# Patient Record
Sex: Male | Born: 1945 | Race: White | Hispanic: No | Marital: Married | Smoking: Never smoker
Health system: Southern US, Community
[De-identification: ages and names within clinical notes are randomized; demographics above are authoritative.]

## PROBLEM LIST (undated history)

## (undated) DIAGNOSIS — Z87442 Personal history of urinary calculi: Secondary | ICD-10-CM

## (undated) DIAGNOSIS — I10 Essential (primary) hypertension: Secondary | ICD-10-CM

## (undated) DIAGNOSIS — C801 Malignant (primary) neoplasm, unspecified: Secondary | ICD-10-CM

## (undated) HISTORY — PX: TONSILLECTOMY: SUR1361

## (undated) HISTORY — PX: PERCUTANEOUS NEPHROSTOLITHOTOMY: SHX2207

---

## 2000-01-18 ENCOUNTER — Ambulatory Visit (HOSPITAL_COMMUNITY): Admission: RE | Admit: 2000-01-18 | Discharge: 2000-01-18 | Payer: Self-pay | Admitting: Gastroenterology

## 2003-12-09 ENCOUNTER — Ambulatory Visit (HOSPITAL_COMMUNITY): Admission: RE | Admit: 2003-12-09 | Discharge: 2003-12-09 | Payer: Self-pay | Admitting: Gastroenterology

## 2014-01-23 DIAGNOSIS — Z23 Encounter for immunization: Secondary | ICD-10-CM | POA: Diagnosis not present

## 2014-01-23 DIAGNOSIS — Z0000 Encounter for general adult medical examination without abnormal findings: Secondary | ICD-10-CM | POA: Diagnosis not present

## 2014-01-23 DIAGNOSIS — R82998 Other abnormal findings in urine: Secondary | ICD-10-CM | POA: Diagnosis not present

## 2014-01-23 DIAGNOSIS — D126 Benign neoplasm of colon, unspecified: Secondary | ICD-10-CM | POA: Diagnosis not present

## 2014-01-23 DIAGNOSIS — Z125 Encounter for screening for malignant neoplasm of prostate: Secondary | ICD-10-CM | POA: Diagnosis not present

## 2014-01-23 DIAGNOSIS — I10 Essential (primary) hypertension: Secondary | ICD-10-CM | POA: Diagnosis not present

## 2014-01-23 DIAGNOSIS — E782 Mixed hyperlipidemia: Secondary | ICD-10-CM | POA: Diagnosis not present

## 2014-03-11 DIAGNOSIS — R972 Elevated prostate specific antigen [PSA]: Secondary | ICD-10-CM | POA: Diagnosis not present

## 2014-03-11 DIAGNOSIS — I10 Essential (primary) hypertension: Secondary | ICD-10-CM | POA: Diagnosis not present

## 2014-03-11 DIAGNOSIS — Z125 Encounter for screening for malignant neoplasm of prostate: Secondary | ICD-10-CM | POA: Diagnosis not present

## 2014-03-11 DIAGNOSIS — E782 Mixed hyperlipidemia: Secondary | ICD-10-CM | POA: Diagnosis not present

## 2014-03-11 DIAGNOSIS — R799 Abnormal finding of blood chemistry, unspecified: Secondary | ICD-10-CM | POA: Diagnosis not present

## 2014-03-11 DIAGNOSIS — R82998 Other abnormal findings in urine: Secondary | ICD-10-CM | POA: Diagnosis not present

## 2014-07-29 DIAGNOSIS — Z125 Encounter for screening for malignant neoplasm of prostate: Secondary | ICD-10-CM | POA: Diagnosis not present

## 2014-07-29 DIAGNOSIS — R972 Elevated prostate specific antigen [PSA]: Secondary | ICD-10-CM | POA: Diagnosis not present

## 2014-08-06 DIAGNOSIS — H2513 Age-related nuclear cataract, bilateral: Secondary | ICD-10-CM | POA: Diagnosis not present

## 2014-08-21 DIAGNOSIS — R972 Elevated prostate specific antigen [PSA]: Secondary | ICD-10-CM | POA: Diagnosis not present

## 2014-08-21 DIAGNOSIS — R351 Nocturia: Secondary | ICD-10-CM | POA: Diagnosis not present

## 2014-08-21 DIAGNOSIS — R3912 Poor urinary stream: Secondary | ICD-10-CM | POA: Diagnosis not present

## 2014-08-21 DIAGNOSIS — N401 Enlarged prostate with lower urinary tract symptoms: Secondary | ICD-10-CM | POA: Diagnosis not present

## 2015-02-27 DIAGNOSIS — K635 Polyp of colon: Secondary | ICD-10-CM | POA: Diagnosis not present

## 2015-02-27 DIAGNOSIS — E782 Mixed hyperlipidemia: Secondary | ICD-10-CM | POA: Diagnosis not present

## 2015-02-27 DIAGNOSIS — R972 Elevated prostate specific antigen [PSA]: Secondary | ICD-10-CM | POA: Diagnosis not present

## 2015-02-27 DIAGNOSIS — Z23 Encounter for immunization: Secondary | ICD-10-CM | POA: Diagnosis not present

## 2015-02-27 DIAGNOSIS — Z0000 Encounter for general adult medical examination without abnormal findings: Secondary | ICD-10-CM | POA: Diagnosis not present

## 2015-02-27 DIAGNOSIS — I10 Essential (primary) hypertension: Secondary | ICD-10-CM | POA: Diagnosis not present

## 2015-03-12 DIAGNOSIS — R972 Elevated prostate specific antigen [PSA]: Secondary | ICD-10-CM | POA: Diagnosis not present

## 2015-03-12 DIAGNOSIS — C61 Malignant neoplasm of prostate: Secondary | ICD-10-CM | POA: Diagnosis not present

## 2015-03-18 DIAGNOSIS — R3912 Poor urinary stream: Secondary | ICD-10-CM | POA: Diagnosis not present

## 2015-03-18 DIAGNOSIS — R351 Nocturia: Secondary | ICD-10-CM | POA: Diagnosis not present

## 2015-03-18 DIAGNOSIS — C61 Malignant neoplasm of prostate: Secondary | ICD-10-CM | POA: Diagnosis not present

## 2015-03-19 ENCOUNTER — Other Ambulatory Visit (HOSPITAL_COMMUNITY): Payer: Self-pay | Admitting: Urology

## 2015-03-19 DIAGNOSIS — C61 Malignant neoplasm of prostate: Secondary | ICD-10-CM

## 2015-04-03 ENCOUNTER — Encounter (HOSPITAL_COMMUNITY)
Admission: RE | Admit: 2015-04-03 | Discharge: 2015-04-03 | Disposition: A | Discharge status: Home / Self Care | Payer: Medicare Other | Source: Ambulatory Visit | Attending: Urology | Admitting: Urology

## 2015-04-03 DIAGNOSIS — N200 Calculus of kidney: Secondary | ICD-10-CM | POA: Diagnosis not present

## 2015-04-03 DIAGNOSIS — K5730 Diverticulosis of large intestine without perforation or abscess without bleeding: Secondary | ICD-10-CM | POA: Diagnosis not present

## 2015-04-03 DIAGNOSIS — C61 Malignant neoplasm of prostate: Secondary | ICD-10-CM | POA: Diagnosis present

## 2015-04-03 DIAGNOSIS — R351 Nocturia: Secondary | ICD-10-CM | POA: Diagnosis not present

## 2015-04-03 DIAGNOSIS — N3289 Other specified disorders of bladder: Secondary | ICD-10-CM | POA: Diagnosis not present

## 2015-04-03 MED ORDER — TECHNETIUM TC 99M MEDRONATE IV KIT
27.4000 | PACK | Freq: Once | INTRAVENOUS | Status: AC | PRN
Start: 2015-04-03 — End: 2015-04-03
  Administered 2015-04-03: 27.4 via INTRAVENOUS

## 2015-04-09 DIAGNOSIS — C61 Malignant neoplasm of prostate: Secondary | ICD-10-CM | POA: Diagnosis not present

## 2015-04-22 DIAGNOSIS — C61 Malignant neoplasm of prostate: Secondary | ICD-10-CM | POA: Diagnosis not present

## 2015-04-22 DIAGNOSIS — N323 Diverticulum of bladder: Secondary | ICD-10-CM | POA: Diagnosis not present

## 2015-04-24 ENCOUNTER — Other Ambulatory Visit: Payer: Self-pay | Admitting: Urology

## 2015-05-05 DIAGNOSIS — C61 Malignant neoplasm of prostate: Secondary | ICD-10-CM | POA: Diagnosis not present

## 2015-05-05 DIAGNOSIS — N323 Diverticulum of bladder: Secondary | ICD-10-CM | POA: Diagnosis not present

## 2015-05-05 DIAGNOSIS — M6281 Muscle weakness (generalized): Secondary | ICD-10-CM | POA: Diagnosis not present

## 2015-05-05 DIAGNOSIS — R278 Other lack of coordination: Secondary | ICD-10-CM | POA: Diagnosis not present

## 2015-05-07 ENCOUNTER — Other Ambulatory Visit (HOSPITAL_COMMUNITY): Payer: Self-pay | Admitting: Urology

## 2015-05-07 NOTE — Patient Instructions (Addendum)
GUSTAVE LINDEMAN  05/07/2015   Your procedure is scheduled on: Thursday  05/22/2015  Report to [facility] Main  Entrance take [place]  elevators to 3rd floor to  [place] at  [place] AM.  Call this number if you have problems the morning of surgery [phone]   Remember: ONLY 1 PERSON MAY GO WITH YOU TO SHORT STAY TO GET  READY MORNING OF [place].  Do not eat food or drink liquids :After Midnight.                   FOLLOW BOWEL PREP INSTRUCTIONS FROM DR. BORDEN'S OFFICE AS DIRECTED WITH A CLEAR LIQUID DIET ALL DAY THE DAY BEFORE YOUR SURGERY!     Take these medicines the morning of surgery with A SIP OF WATER: Metoprolol                               You may not have any metal on your body including hair pins and              piercings  Do not wear jewelry, make-up, lotions, powders or perfumes, deodorant             Do not wear nail polish.  Do not shave  48 hours prior to surgery.              Men may shave face and neck.   Do not bring valuables to the hospital. [place].  Contacts, dentures or bridgework may not be worn into surgery.  Leave suitcase in the car. After surgery it may be brought to your room.     Patients discharged the day of surgery will not be allowed to drive home.  Name and phone number of your driver:  Special Instructions: N/A              Please read over the following fact sheets you were given: _____________________________________________________________________             [facility] - Preparing for Surgery Before surgery, you can play an important role.  Because skin is not sterile, your skin needs to be as free of germs as possible.  You can reduce the number of germs on your skin by washing with CHG (chlorahexidine gluconate) soap before surgery.  CHG is an antiseptic cleaner which kills germs and bonds with the skin to continue killing germs even after  washing. Please DO NOT use if you have an allergy to CHG or antibacterial soaps.  If your skin becomes reddened/irritated stop using the CHG and inform your nurse when you arrive at Short Stay. Do not shave (including legs and underarms) for at least 48 hours prior to the first CHG shower.  You may shave your face/neck. Please follow these instructions carefully:  1.  Shower with CHG Soap the night before surgery and the  morning of Surgery.  2.  If you choose to wash your hair, wash your hair first as usual with your  normal  shampoo.  3.  After you shampoo, rinse your hair and body thoroughly to remove the  shampoo.  4.  Use CHG as you would any other liquid soap.  You can apply chg directly  to the skin and wash                       Gently with a scrungie or clean washcloth.  5.  Apply the CHG Soap to your body ONLY FROM THE NECK DOWN.   Do not use on face/ open                           Wound or open sores. Avoid contact with eyes, ears mouth and genitals (private parts).                       Wash face,  Genitals (private parts) with your normal soap.             6.  Wash thoroughly, paying special attention to the area where your surgery  will be performed.  7.  Thoroughly rinse your body with warm water from the neck down.  8.  DO NOT shower/wash with your normal soap after using and rinsing off  the CHG Soap.                9.  Pat yourself dry with a clean towel.            10.  Wear clean pajamas.            11.  Place clean sheets on your bed the night of your first shower and do not  sleep with pets. Day of Surgery : Do not apply any lotions/deodorants the morning of surgery.  Please wear clean clothes to the hospital/surgery center.  FAILURE TO FOLLOW THESE INSTRUCTIONS MAY RESULT IN THE CANCELLATION OF YOUR SURGERY PATIENT SIGNATURE_________________________________  NURSE  SIGNATURE__________________________________  ________________________________________________________________________   Adam Phenix  An incentive spirometer is a tool that can help keep your lungs clear and active. This tool measures how well you are filling your lungs with each breath. Taking long deep breaths may help reverse or decrease the chance of developing breathing (pulmonary) problems (especially infection) following:  A long period of time when you are unable to move or be active. BEFORE THE PROCEDURE   If the spirometer includes an indicator to show your best effort, your nurse or respiratory therapist will set it to a desired goal.  If possible, sit up straight or lean slightly forward. Try not to slouch.  Hold the incentive spirometer in an upright position. INSTRUCTIONS FOR USE   Sit on the edge of your bed if possible, or sit up as far as you can in bed or on a chair.  Hold the incentive spirometer in an upright position.  Breathe out normally.  Place the mouthpiece in your mouth and seal your lips tightly around it.  Breathe in slowly and as deeply as possible, raising the piston or the ball toward the top of the column.  Hold your breath for 3-5 seconds or for as long as possible. Allow the piston or ball to fall to the bottom of the column.  Remove the mouthpiece from your mouth and breathe out normally.  Rest for a few seconds and repeat Steps 1 through 7 at least 10 times every 1-2 hours when you are awake. Take your time and take a few normal breaths between deep breaths.  The spirometer may include an indicator to show  your best effort. Use the indicator as a goal to work toward during each repetition.  After each set of 10 deep breaths, practice coughing to be sure your lungs are clear. If you have an incision (the cut made at the time of surgery), support your incision when coughing by placing a pillow or rolled up towels firmly against it. Once  you are able to get out of bed, walk around indoors and cough well. You may stop using the incentive spirometer when instructed by your caregiver.  RISKS AND COMPLICATIONS  Take your time so you do not get dizzy or light-headed.  If you are in pain, you may need to take or ask for pain medication before doing incentive spirometry. It is harder to take a deep breath if you are having pain. AFTER USE  Rest and breathe slowly and easily.  It can be helpful to keep track of a log of your progress. Your caregiver can provide you with a simple table to help with this. If you are using the spirometer at home, follow these instructions: [place] IF:   You are having difficultly using the spirometer.  You have trouble using the spirometer as often as instructed.  Your pain medication is not giving enough relief while using the spirometer.  You develop fever of 100.5 F (38.1 C) or higher. SEEK IMMEDIATE MEDICAL CARE IF:   You cough up bloody sputum that had not been present before.  You develop fever of 102 F (38.9 C) or greater.  You develop worsening pain at or near the incision site. MAKE SURE YOU:   Understand these instructions.  Will watch your condition.  Will get help right away if you are not doing well or get worse. Document Released: 02/07/2007 Document Revised: 12/20/2011 Document Reviewed: 04/10/2007 ExitCare Patient Information 2014 ExitCare, [place].   ________________________________________________________________________  WHAT IS A BLOOD TRANSFUSION? Blood Transfusion Information  A transfusion is the replacement of blood or some of its parts. Blood is made up of multiple cells which provide different functions.  Red blood cells carry oxygen and are used for blood loss replacement.  White blood cells fight against infection.  Platelets control bleeding.  Plasma helps clot blood.  Other blood products are available for specialized needs, such as  hemophilia or other clotting disorders. BEFORE THE TRANSFUSION  Who gives blood for transfusions?   Healthy volunteers who are fully evaluated to make sure their blood is safe. This is blood bank blood. Transfusion therapy is the safest it has ever been in the practice of medicine. Before blood is taken from a donor, a complete history is taken to make sure that person has no history of diseases nor engages in risky social behavior (examples are intravenous drug use or sexual activity with multiple partners). The donor's travel history is screened to minimize risk of transmitting infections, such as malaria. The donated blood is tested for signs of infectious diseases, such as HIV and hepatitis. The blood is then tested to be sure it is compatible with you in order to minimize the chance of a transfusion reaction. If you or a relative donates blood, this is often done in anticipation of surgery and is not appropriate for emergency situations. It takes many days to process the donated blood. RISKS AND COMPLICATIONS Although transfusion therapy is very safe and saves many lives, the main dangers of transfusion include:   Getting an infectious disease.  Developing a transfusion reaction. This is an allergic reaction to  something in the blood you were given. Every precaution is taken to prevent this. The decision to have a blood transfusion has been considered carefully by your caregiver before blood is given. Blood is not given unless the benefits outweigh the risks. AFTER THE TRANSFUSION  Right after receiving a blood transfusion, you will usually feel much better and more energetic. This is especially true if your red blood cells have gotten low (anemic). The transfusion raises the level of the red blood cells which carry oxygen, and this usually causes an energy increase.  The nurse administering the transfusion will monitor you carefully for complications. HOME CARE INSTRUCTIONS  No special  instructions are needed after a transfusion. You may find your energy is better. Speak with your caregiver about any limitations on activity for underlying diseases you may have. SEEK MEDICAL CARE IF:   Your condition is not improving after your transfusion.  You develop redness or irritation at the intravenous (IV) site. SEEK IMMEDIATE MEDICAL CARE IF:  Any of the following symptoms occur over the next 12 hours:  Shaking chills.  You have a temperature by mouth above 102 F (38.9 C), not controlled by medicine.  Chest, back, or muscle pain.  People around you feel you are not acting correctly or are confused.  Shortness of breath or difficulty breathing.  Dizziness and fainting.  You get a rash or develop hives.  You have a decrease in urine output.  Your urine turns a dark color or changes to pink, red, or brown. Any of the following symptoms occur over the next 10 days:  You have a temperature by mouth above 102 F (38.9 C), not controlled by medicine.  Shortness of breath.  Weakness after normal activity.  The white part of the eye turns yellow (jaundice).  You have a decrease in the amount of urine or are urinating less often.  Your urine turns a dark color or changes to pink, red, or brown. Document Released: 09/24/2000 Document Revised: 12/20/2011 Document Reviewed: 05/13/2008 ExitCare Patient Information 2014 ExitCare, [place].  _______________________________________________________________________   CLEAR LIQUID DIET   Foods Allowed                                                                     Foods Excluded  Coffee and tea, regular and decaf                             liquids that you cannot  Plain Jell-O in any flavor                                             see through such as: Fruit ices (not with fruit pulp)                                     milk, soups, orange juice  Iced Popsicles  All solid  food Carbonated beverages, regular and diet                                    Cranberry, grape and apple juices Sports drinks like Gatorade Lightly seasoned clear broth or consume(fat free) Sugar, honey syrup  Sample Menu Breakfast                                Lunch                                     Supper Cranberry juice                    Beef broth                            Chicken broth Jell-O                                     Grape juice                           Apple juice Coffee or tea                        Jell-O                                      Popsicle                                                Coffee or tea                        Coffee or tea  _____________________________________________________________________

## 2015-05-08 ENCOUNTER — Ambulatory Visit (HOSPITAL_COMMUNITY)
Admission: RE | Admit: 2015-05-08 | Discharge: 2015-05-08 | Disposition: A | Discharge status: Home / Self Care | Payer: Medicare Other | Source: Ambulatory Visit | Attending: Anesthesiology | Admitting: Anesthesiology

## 2015-05-08 ENCOUNTER — Encounter (HOSPITAL_COMMUNITY): Payer: Self-pay

## 2015-05-08 ENCOUNTER — Other Ambulatory Visit: Payer: Self-pay

## 2015-05-08 ENCOUNTER — Encounter (INDEPENDENT_AMBULATORY_CARE_PROVIDER_SITE_OTHER): Payer: Self-pay

## 2015-05-08 ENCOUNTER — Encounter (HOSPITAL_COMMUNITY)
Admission: RE | Admit: 2015-05-08 | Discharge: 2015-05-08 | Disposition: A | Discharge status: Home / Self Care | Payer: Medicare Other | Source: Ambulatory Visit | Attending: Urology | Admitting: Urology

## 2015-05-08 DIAGNOSIS — C61 Malignant neoplasm of prostate: Secondary | ICD-10-CM | POA: Insufficient documentation

## 2015-05-08 DIAGNOSIS — Z01818 Encounter for other preprocedural examination: Secondary | ICD-10-CM | POA: Insufficient documentation

## 2015-05-08 HISTORY — DX: Malignant (primary) neoplasm, unspecified: C80.1

## 2015-05-08 HISTORY — DX: Essential (primary) hypertension: I10

## 2015-05-08 LAB — BASIC METABOLIC PANEL
Anion gap: 7 (ref 5–15)
BUN: 24 mg/dL — AB (ref 6–20)
CHLORIDE: 109 mmol/L (ref 101–111)
CO2: 24 mmol/L (ref 22–32)
Calcium: 9.2 mg/dL (ref 8.9–10.3)
Creatinine, Ser: 1.33 mg/dL — ABNORMAL HIGH (ref 0.61–1.24)
GFR calc Af Amer: >60 mL/min (ref >60)
GFR calc non Af Amer: 53 mL/min — ABNORMAL LOW (ref >60)
GLUCOSE: 87 mg/dL (ref 65–99)
Potassium: 4.1 mmol/L (ref 3.5–5.1)
SODIUM: 140 mmol/L (ref 135–145)

## 2015-05-08 LAB — CBC
HEMATOCRIT: 42.1 % (ref 39.0–52.0)
Hemoglobin: 13.8 g/dL (ref 13.0–17.0)
MCH: 29.7 pg (ref 26.0–34.0)
MCHC: 32.8 g/dL (ref 30.0–36.0)
MCV: 90.7 fL (ref 78.0–100.0)
PLATELETS: 202 10*3/uL (ref 150–400)
RBC: 4.64 MIL/uL (ref 4.22–5.81)
RDW: 14.2 % (ref 11.5–15.5)
WBC: 4.7 10*3/uL (ref 4.0–10.5)

## 2015-05-08 NOTE — Progress Notes (Signed)
   05/08/15 1521  OBSTRUCTIVE SLEEP APNEA  Have you ever been diagnosed with sleep apnea through a sleep study? No  Do you snore loudly (loud enough to be heard through closed doors)?  1  Do you often feel tired, fatigued, or sleepy during the daytime? 0  Has anyone observed you stop breathing during your sleep? 0  Do you have, or are you being treated for high blood pressure? 1  BMI more than 35 kg/m2? 0  Age over 70 years old? 1  Neck circumference greater than 40 cm/16 inches? 0  Gender: 1

## 2015-05-21 NOTE — H&P (Signed)
Chief Complaint Prostate Cancer   Reason For Visit Reason for consult: To discuss treatment options for prostate cancer and his bladder diverticulum and specifically to consider robotic surgery.  Physician requesting consult: Dr. Carolan Clines  PCP: Dr. 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 He saw Dr. Gaynelle Arabian who performed a 4K test that demonstrated a total PSA of 4.09 and 4K score suggesting a 19% of high grade disease. Dr. Gaynelle Arabian recommended a prostate biopsy at that time, but Curtis Odom wanted to delay his biopsy. He ultimately underwent a prostate needle biopsy on 03/12/15 which confirmed Gleason 4+5=9 adenocarcinoma of the prostate with 2 out of 12 biopsy cores positive. He has no family history of prostate cancer. He underwent staging studies on 04/03/15 including a bone scan and CT scan which were both negative for metastatic disease. 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    ** He has minimal comorbid conditions including only hypertension.    TNM stage: cT1c N0 M0  PSA: 4.09  Gleason score: 4+5=9  Biopsy (03/12/15): 2/12 cores -- L lateral mid (< 5%, 4+5=9), R lateral base (40%, 4+5=9)  Prostate volume: 18.5 cc    Nomogram  OC disease: 38%  EPE: 59%  SVI: 9%  LNI: 11%  PFS (surgery): 63% at 5 years, 47% at 10 years    Urinary function: He denies significant urinary symptoms. IPSS is 2.  Erectile function: He does have mild-to-moderate erectile dysfunction. He does find it somewhat difficult to obtain erections although has not undergone prior treatment. SHIM score is 20.   Past Medical History Problems  1. History of hypertension (Z86.79)  Surgical  History Problems  1. History of Tonsillectomy  Current Meds 1. Lopressor 50 MG Oral Tablet;  Therapy: (Recorded:11Nov2015) to Recorded  Allergies No known drug allergies. He has had GI upset but no severe reaction associated with ciprofloxacin.   Family History Problems  1. Family history of Deceased : Mother, Father 2. Family history of colon cancer (Z80.0) : Mother 3. Family history of congestive heart failure (Z82.49) : Father 4. Denied: Family history of prostate cancer  Social History Problems    Married   Never a smoker   No alcohol use   Retired  Review of Systems Genitourinary, constitutional, skin, eye, otolaryngeal, hematologic/lymphatic, cardiovascular, pulmonary, endocrine, musculoskeletal, gastrointestinal, neurological and psychiatric system(s) were reviewed and pertinent findings if present are noted and are otherwise negative.    Vitals Vital Signs [Data Includes: Last 1 Day]  Recorded: 12Jul2016 01:06PM  Blood Pressure: 172 / 80 Temperature: 97.6 F Heart Rate: 63 Recorded: 29Jun2016 05:05PM  Blood Pressure: 178 / 72 Heart Rate: 71  Physical Exam Constitutional: Well nourished and well developed . No acute distress.  ENT:. The ears and nose are normal in appearance.  Neck: The appearance of the neck is normal and no neck mass is present.  Pulmonary: No respiratory distress, normal respiratory rhythm and effort and clear bilateral breath sounds.  Cardiovascular: Heart rate and rhythm are normal . No peripheral edema.  Abdomen: The abdomen is soft and nontender. No masses are palpated. No CVA tenderness. No hernias are palpable. No hepatosplenomegaly noted.  Rectal: Rectal exam demonstrates normal sphincter tone, no tenderness and  no masses. There is some very mild induration noted toward the right base near the seminal vesicle. It is unclear whether this might be related to postbiopsy changes. The prostate is not tender. The left seminal vesicle is  nonpalpable. The right seminal vesicle is nonpalpable. The perineum is normal on inspection.  Genitourinary: Examination of the penis demonstrates no discharge, no masses, no lesions and a normal meatus. The scrotum is without lesions. The right epididymis is palpably normal and non-tender. The left epididymis is palpably normal and non-tender. The right testis is non-tender and without masses. The left testis is non-tender and without masses.  Lymphatics: The femoral and inguinal nodes are not enlarged or tender.  Skin: Normal skin turgor, no visible rash and no visible skin lesions.  Neuro/Psych:. Mood and affect are appropriate.    Results/Data Urine [Data Includes: Last 1 Day]   [id number]  COLOR YELLOW   APPEARANCE CLEAR   SPECIFIC GRAVITY 1.025   pH 5.5   GLUCOSE NEG mg/dL  BILIRUBIN NEG   KETONE NEG mg/dL  BLOOD TRACE   PROTEIN NEG mg/dL  UROBILINOGEN 0.2 mg/dL  NITRITE NEG   LEUKOCYTE ESTERASE NEG   SQUAMOUS EPITHELIAL/HPF RARE   WBC 0-2 WBC/hpf  RBC 0-2 RBC/hpf  BACTERIA RARE   CRYSTALS NONE SEEN   CASTS NONE SEEN   Other MUCUS NOTED    I have independently reviewed his medical records, PSA results, and pathology report. I have also independently reviewed his bone scan and CT scan. Findings are as dictated above.   Assessment Assessed  1. 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 UA With REFLEX; [Do Not Release]; Status:Complete;   Done: [id number] 12:50PM Prostate cancer  2. Follow-up Office  Follow-up  Status: Complete  Done: [id number] 3. PT/OT Referral Referral  Referral  Status: Hold For - Date of Service,Physical Therapy   Requested for: 25Jul2016  Discussion/Summary 1. High risk clinically localized prostate cancer: I had a detailed discussion with Curtis Odom and his wife today. I did recommend therapy of curative intent considering his high risk localized malignancy in the absence of metastatic disease. We discussed  the options of proceeding with primary surgical therapy possibly with the need for adjuvant therapy versus the option of primary radiation therapy in combination with androgen deprivation. We reviewed the pros and cons of each of these approaches.   The patient was counseled about the natural history of prostate cancer and the standard treatment options that are available for prostate cancer. It was explained to him how his age and life expectancy, clinical stage, Gleason score, and PSA affect his prognosis, the decision to proceed with additional staging studies, as well as how that information influences recommended treatment strategies. We discussed the roles for active surveillance, radiation therapy, surgical therapy, androgen deprivation, as well as ablative therapy options for the treatment of prostate cancer as appropriate to his individual cancer situation. We discussed the risks and benefits of these options with regard to their impact on cancer control and also in terms of potential adverse events, complications, and impact on quiality of life particularly related to urinary, bowel, and sexual function. The patient was encouraged to ask questions throughout the discussion today and all questions were answered to his stated satisfaction. In addition, the patient was provided with and/or directed to appropriate resources and literature for further education about prostate cancer and treatment options.   We discussed surgical therapy for prostate cancer including the different available surgical  approaches. We discussed, in detail, the risks and expectations of surgery with regard to cancer control, urinary control, and erectile function as well as the expected postoperative recovery process. Additional risks of surgery including but not limited to bleeding, infection, hernia formation, nerve damage, lymphocele formation, bowel/rectal injury potentially necessitating colostomy, damage to the urinary tract  resulting in urine leakage, urethral stricture, and the cardiopulmonary risks such as myocardial infarction, stroke, death, venothromboembolism, etc. were explained. The risk of open surgical conversion for robotic/laparoscopic prostatectomy was also discussed.     Following our discussion, he is most interested in proceeding with primary surgical therapy. I did offer him a radiation oncology consultation which he will consider. In the meantime, he would like to be scheduled for surgical treatment. He will be scheduled for a robot-assisted laparoscopic radical prostatectomy and bilateral pelvic lymphadenectomy. We will likely perform a partial nerve sparing procedure on the right side considering his digital rectal exam although he does not have evidence for obvious extraprostatic disease.    2. Bladder diverticulum: This has been totally asymptomatic without complicating features over the course of his lifetime. He is pretty sure that this was present on an imaging study he had over 10 years ago. We discussed indications for treatment of a bladder diverticulum and he understands that in the absence of surgery for his prostate cancer, I would not recommend surgical intervention of his diverticulum. However, understanding that this can be fairly easily addressed at the time of his upcoming surgery and that it would be more difficult to treat subsequently if it became an issue postoperatively considering adhesions and scar tissue related to his prostatectomy. He also understands that he may be more likely to become symptomatic after having the catheter with the risk of potentially introducing bacteria into the urine. After discussion, he would like to proceed with bladder diverticulectomy at the time of his radical prostatectomy. We have reviewed the specific potential risks and complications related to this portion of the procedure including the added risk of possible ureteral injury or need for ureteral  reimplantation considering the location of the bladder diverticulum. We also discussed the risk of bladder leak and the need for a postoperative cystogram following catheter drainage for 7-10 days. He understands that I also likely will place a preoperative ureteral stent to help identify the ureter intraoperatively.     3. Nonobstructing renal calculi: He does have a fairly large stone on the right side. He understands that he is unlikely to pass the stone. In the absence of symptoms, I did not recommend aggressive treatment that would delay necessary treatment of his prostate cancer. In addition, if he does have a right ureteral stent placed, this will avoid the risk of right-sided obstruction in the immediate postoperative period regardless.    Cc: Dr. Carolan Clines  Dr. Hulan Fess  A total of 65 minutes were spent in the overall care of the patient today with 54 minutes in direct face to face consultation.    Signatures Electronically signed by : Raynelle Bring, M.D.; Apr 22 2015  2:20PM EST

## 2015-05-22 ENCOUNTER — Encounter (HOSPITAL_COMMUNITY): Payer: Self-pay | Admitting: *Deleted

## 2015-05-22 ENCOUNTER — Inpatient Hospital Stay (HOSPITAL_COMMUNITY): Payer: Medicare Other

## 2015-05-22 ENCOUNTER — Inpatient Hospital Stay (HOSPITAL_COMMUNITY): Payer: Medicare Other | Admitting: Anesthesiology

## 2015-05-22 ENCOUNTER — Encounter (HOSPITAL_COMMUNITY)
Admission: RE | Disposition: A | Discharge status: Home / Self Care | Payer: Self-pay | Source: Ambulatory Visit | Attending: Urology

## 2015-05-22 ENCOUNTER — Inpatient Hospital Stay (HOSPITAL_COMMUNITY)
Admission: RE | Admit: 2015-05-22 | Discharge: 2015-05-23 | DRG: 708 | Disposition: A | Discharge status: Home / Self Care | Payer: Medicare Other | Source: Ambulatory Visit | Attending: Urology | Admitting: Urology

## 2015-05-22 DIAGNOSIS — N529 Male erectile dysfunction, unspecified: Secondary | ICD-10-CM | POA: Diagnosis present

## 2015-05-22 DIAGNOSIS — N200 Calculus of kidney: Secondary | ICD-10-CM | POA: Diagnosis present

## 2015-05-22 DIAGNOSIS — C61 Malignant neoplasm of prostate: Secondary | ICD-10-CM | POA: Diagnosis present

## 2015-05-22 DIAGNOSIS — Z800 Family history of malignant neoplasm of digestive organs: Secondary | ICD-10-CM

## 2015-05-22 DIAGNOSIS — I10 Essential (primary) hypertension: Secondary | ICD-10-CM | POA: Diagnosis present

## 2015-05-22 DIAGNOSIS — Z8249 Family history of ischemic heart disease and other diseases of the circulatory system: Secondary | ICD-10-CM | POA: Diagnosis not present

## 2015-05-22 DIAGNOSIS — N323 Diverticulum of bladder: Secondary | ICD-10-CM | POA: Diagnosis present

## 2015-05-22 HISTORY — PX: ROBOTIC ASSISTED LAPAROSCOPIC BLADDER DIVERTICULECTOMY: SHX6079

## 2015-05-22 HISTORY — PX: ROBOT ASSISTED LAPAROSCOPIC RADICAL PROSTATECTOMY: SHX5141

## 2015-05-22 HISTORY — PX: CYSTOSCOPY WITH STENT PLACEMENT: SHX5790

## 2015-05-22 HISTORY — PX: LYMPHADENECTOMY: SHX5960

## 2015-05-22 LAB — HEMOGLOBIN AND HEMATOCRIT, BLOOD
HCT: 42.1 % (ref 39.0–52.0)
HEMOGLOBIN: 14.2 g/dL (ref 13.0–17.0)

## 2015-05-22 LAB — ABO/RH: ABO/RH(D): B POS

## 2015-05-22 LAB — TYPE AND SCREEN
ABO/RH(D): B POS
Antibody Screen: NEGATIVE

## 2015-05-22 SURGERY — ROBOTIC ASSISTED LAPAROSCOPIC RADICAL PROSTATECTOMY LEVEL 3
Anesthesia: General | Laterality: Right

## 2015-05-22 MED ORDER — DOCUSATE SODIUM 100 MG PO CAPS
100.0000 mg | ORAL_CAPSULE | Freq: Two times a day (BID) | ORAL | Status: DC
  Administered 2015-05-22 – 2015-05-23 (×2): 100 mg via ORAL
  Filled 2015-05-22 (×3): qty 1

## 2015-05-22 MED ORDER — SULFAMETHOXAZOLE-TRIMETHOPRIM 800-160 MG PO TABS: 1.0000 | ORAL_TABLET | Freq: Two times a day (BID) | ORAL | Status: DC

## 2015-05-22 MED ORDER — HYDROCODONE-ACETAMINOPHEN 5-325 MG PO TABS
1.0000 | ORAL_TABLET | Freq: Four times a day (QID) | ORAL | Status: DC | PRN
Start: 2015-05-22 — End: 2018-06-21

## 2015-05-22 MED ORDER — LACTATED RINGERS IR SOLN
Status: DC | PRN
  Administered 2015-05-22: 1

## 2015-05-22 MED ORDER — ACETAMINOPHEN 325 MG PO TABS: 650.0000 mg | ORAL_TABLET | ORAL | Status: DC | PRN

## 2015-05-22 MED ORDER — PROMETHAZINE HCL 25 MG/ML IJ SOLN: 6.2500 mg | INTRAMUSCULAR | Status: DC | PRN

## 2015-05-22 MED ORDER — KETOROLAC TROMETHAMINE 15 MG/ML IJ SOLN
15.0000 mg | Freq: Four times a day (QID) | INTRAMUSCULAR | Status: DC
  Administered 2015-05-22 – 2015-05-23 (×5): 15 mg via INTRAVENOUS
  Filled 2015-05-22 (×7): qty 1

## 2015-05-22 MED ORDER — FENTANYL CITRATE (PF) 250 MCG/5ML IJ SOLN
INTRAMUSCULAR | Status: AC
  Filled 2015-05-22: qty 25

## 2015-05-22 MED ORDER — CEFAZOLIN SODIUM 1-5 GM-% IV SOLN
1.0000 g | Freq: Three times a day (TID) | INTRAVENOUS | Status: AC
  Administered 2015-05-22 – 2015-05-23 (×2): 1 g via INTRAVENOUS
  Filled 2015-05-22 (×2): qty 50

## 2015-05-22 MED ORDER — PROPOFOL 10 MG/ML IV BOLUS
INTRAVENOUS | Status: AC
  Filled 2015-05-22: qty 20

## 2015-05-22 MED ORDER — GLYCOPYRROLATE 0.2 MG/ML IJ SOLN
INTRAMUSCULAR | Status: DC | PRN
  Administered 2015-05-22: 0.6 mg via INTRAVENOUS

## 2015-05-22 MED ORDER — SUCCINYLCHOLINE CHLORIDE 20 MG/ML IJ SOLN
INTRAMUSCULAR | Status: DC | PRN
  Administered 2015-05-22: 100 mg via INTRAVENOUS

## 2015-05-22 MED ORDER — MIDAZOLAM HCL 2 MG/2ML IJ SOLN
INTRAMUSCULAR | Status: AC
  Filled 2015-05-22: qty 4

## 2015-05-22 MED ORDER — DEXAMETHASONE SODIUM PHOSPHATE 10 MG/ML IJ SOLN
INTRAMUSCULAR | Status: AC
  Filled 2015-05-22: qty 1

## 2015-05-22 MED ORDER — PHENYLEPHRINE HCL 10 MG/ML IJ SOLN
INTRAMUSCULAR | Status: DC | PRN
  Administered 2015-05-22 (×2): 80 ug via INTRAVENOUS

## 2015-05-22 MED ORDER — LIDOCAINE HCL (CARDIAC) 20 MG/ML IV SOLN
INTRAVENOUS | Status: DC | PRN
  Administered 2015-05-22: 100 mg via INTRAVENOUS

## 2015-05-22 MED ORDER — SODIUM CHLORIDE 0.9 % IR SOLN
Status: DC | PRN
  Administered 2015-05-22: 1000 mL via INTRAVESICAL

## 2015-05-22 MED ORDER — HYDROMORPHONE HCL 1 MG/ML IJ SOLN
INTRAMUSCULAR | Status: AC
  Filled 2015-05-22: qty 1

## 2015-05-22 MED ORDER — SODIUM CHLORIDE 0.9 % IV BOLUS (SEPSIS)
1000.0000 mL | Freq: Once | INTRAVENOUS | Status: AC
  Administered 2015-05-22: 1000 mL via INTRAVENOUS

## 2015-05-22 MED ORDER — LACTATED RINGERS IV SOLN
INTRAVENOUS | Status: DC | PRN
  Administered 2015-05-22 (×2): via INTRAVENOUS

## 2015-05-22 MED ORDER — PROPOFOL 10 MG/ML IV BOLUS
INTRAVENOUS | Status: DC | PRN
  Administered 2015-05-22: 200 mg via INTRAVENOUS

## 2015-05-22 MED ORDER — SODIUM CHLORIDE 0.9 % IR SOLN
Status: DC | PRN
  Administered 2015-05-22: 3000 mL via INTRAVESICAL

## 2015-05-22 MED ORDER — MORPHINE SULFATE 10 MG/ML IJ SOLN: 2.0000 mg | INTRAMUSCULAR | Status: DC | PRN

## 2015-05-22 MED ORDER — DEXAMETHASONE SODIUM PHOSPHATE 10 MG/ML IJ SOLN
INTRAMUSCULAR | Status: DC | PRN
  Administered 2015-05-22: 10 mg via INTRAVENOUS

## 2015-05-22 MED ORDER — ONDANSETRON HCL 4 MG/2ML IJ SOLN
INTRAMUSCULAR | Status: AC
Start: 2015-05-22 — End: 2015-05-22
  Filled 2015-05-22: qty 2

## 2015-05-22 MED ORDER — BUPIVACAINE-EPINEPHRINE 0.25% -1:200000 IJ SOLN
INTRAMUSCULAR | Status: DC | PRN
  Administered 2015-05-22: 30 mL

## 2015-05-22 MED ORDER — STERILE WATER FOR IRRIGATION IR SOLN
Status: DC | PRN
  Administered 2015-05-22: 1000 mL

## 2015-05-22 MED ORDER — CEFAZOLIN SODIUM-DEXTROSE 2-3 GM-% IV SOLR
2.0000 g | INTRAVENOUS | Status: AC
  Administered 2015-05-22: 2 g via INTRAVENOUS

## 2015-05-22 MED ORDER — ROCURONIUM BROMIDE 100 MG/10ML IV SOLN
INTRAVENOUS | Status: DC | PRN
  Administered 2015-05-22: 10 mg via INTRAVENOUS
  Administered 2015-05-22: 50 mg via INTRAVENOUS
  Administered 2015-05-22: 20 mg via INTRAVENOUS

## 2015-05-22 MED ORDER — FENTANYL CITRATE (PF) 100 MCG/2ML IJ SOLN
INTRAMUSCULAR | Status: DC | PRN
  Administered 2015-05-22 (×2): 50 ug via INTRAVENOUS
  Administered 2015-05-22: 100 ug via INTRAVENOUS
  Administered 2015-05-22: 50 ug via INTRAVENOUS

## 2015-05-22 MED ORDER — KCL IN DEXTROSE-NACL 20-5-0.45 MEQ/L-%-% IV SOLN
INTRAVENOUS | Status: AC
Start: 2015-05-22 — End: 2015-05-22
  Administered 2015-05-22: 12:00:00
  Filled 2015-05-22: qty 1000

## 2015-05-22 MED ORDER — HEPARIN SODIUM (PORCINE) 1000 UNIT/ML IJ SOLN
INTRAMUSCULAR | Status: AC
  Filled 2015-05-22: qty 1

## 2015-05-22 MED ORDER — ROCURONIUM BROMIDE 100 MG/10ML IV SOLN
INTRAVENOUS | Status: AC
  Filled 2015-05-22: qty 1

## 2015-05-22 MED ORDER — HYDROMORPHONE HCL 1 MG/ML IJ SOLN
0.2500 mg | INTRAMUSCULAR | Status: DC | PRN
  Administered 2015-05-22 (×2): 0.5 mg via INTRAVENOUS

## 2015-05-22 MED ORDER — ONDANSETRON HCL 4 MG/2ML IJ SOLN
INTRAMUSCULAR | Status: DC | PRN
  Administered 2015-05-22: 4 mg via INTRAVENOUS

## 2015-05-22 MED ORDER — DIPHENHYDRAMINE HCL 12.5 MG/5ML PO ELIX: 12.5000 mg | ORAL_SOLUTION | Freq: Four times a day (QID) | ORAL | Status: DC | PRN

## 2015-05-22 MED ORDER — BUPIVACAINE-EPINEPHRINE (PF) 0.25% -1:200000 IJ SOLN
INTRAMUSCULAR | Status: AC
  Filled 2015-05-22: qty 30

## 2015-05-22 MED ORDER — KCL IN DEXTROSE-NACL 20-5-0.45 MEQ/L-%-% IV SOLN
INTRAVENOUS | Status: DC
  Administered 2015-05-22 – 2015-05-23 (×3): via INTRAVENOUS
  Filled 2015-05-22 (×4): qty 1000

## 2015-05-22 MED ORDER — METOPROLOL TARTRATE 25 MG PO TABS
25.0000 mg | ORAL_TABLET | Freq: Two times a day (BID) | ORAL | Status: DC
  Administered 2015-05-22 – 2015-05-23 (×2): 25 mg via ORAL
  Filled 2015-05-22 (×2): qty 1

## 2015-05-22 MED ORDER — NEOSTIGMINE METHYLSULFATE 10 MG/10ML IV SOLN
INTRAVENOUS | Status: DC | PRN
  Administered 2015-05-22: 4 mg via INTRAVENOUS

## 2015-05-22 MED ORDER — LIDOCAINE HCL (CARDIAC) 20 MG/ML IV SOLN
INTRAVENOUS | Status: AC
  Filled 2015-05-22: qty 5

## 2015-05-22 MED ORDER — ACETAMINOPHEN 10 MG/ML IV SOLN
1000.0000 mg | Freq: Once | INTRAVENOUS | Status: AC
  Administered 2015-05-22: 1000 mg via INTRAVENOUS

## 2015-05-22 MED ORDER — PHENYLEPHRINE 40 MCG/ML (10ML) SYRINGE FOR IV PUSH (FOR BLOOD PRESSURE SUPPORT)
PREFILLED_SYRINGE | INTRAVENOUS | Status: AC
  Filled 2015-05-22: qty 10

## 2015-05-22 MED ORDER — CEFAZOLIN SODIUM-DEXTROSE 2-3 GM-% IV SOLR
INTRAVENOUS | Status: AC
  Filled 2015-05-22: qty 50

## 2015-05-22 MED ORDER — DIPHENHYDRAMINE HCL 50 MG/ML IJ SOLN: 12.5000 mg | Freq: Four times a day (QID) | INTRAMUSCULAR | Status: DC | PRN

## 2015-05-22 MED ORDER — LACTATED RINGERS IV SOLN
INTRAVENOUS | Status: DC | PRN
  Administered 2015-05-22: 1 mL

## 2015-05-22 MED ORDER — MIDAZOLAM HCL 5 MG/5ML IJ SOLN
INTRAMUSCULAR | Status: DC | PRN
  Administered 2015-05-22: 2 mg via INTRAVENOUS

## 2015-05-22 SURGICAL SUPPLY — 97 items
ADAPTER GOLDBERG URETERAL (ADAPTER) ×5 IMPLANT
APPLICATOR COTTON TIP 6IN STRL (MISCELLANEOUS) ×5 IMPLANT
APPLICATOR SURGIFLO ENDO (HEMOSTASIS) ×5 IMPLANT
BLADE SURG SZ10 CARB STEEL (BLADE) ×5 IMPLANT
CABLE HIGH FREQUENCY MONO STRZ (ELECTRODE) ×5 IMPLANT
CATH FOLEY 2WAY SLVR 18FR 30CC (CATHETERS) ×5 IMPLANT
CATH ROBINSON RED A/P 16FR (CATHETERS) ×5 IMPLANT
CATH ROBINSON RED A/P 8FR (CATHETERS) ×5 IMPLANT
CATH TIEMANN FOLEY 18FR 5CC (CATHETERS) ×5 IMPLANT
CHLORAPREP W/TINT 26ML (MISCELLANEOUS) ×5 IMPLANT
CLIP LIGATING HEM O LOK PURPLE (MISCELLANEOUS) ×20 IMPLANT
CLOTH BEACON ORANGE TIMEOUT ST (SAFETY) ×5 IMPLANT
CORD HIGH FREQUENCY UNIPOLAR (ELECTROSURGICAL) ×5 IMPLANT
COVER SURGICAL LIGHT HANDLE (MISCELLANEOUS) ×5 IMPLANT
COVER TIP SHEARS 8 DVNC (MISCELLANEOUS) ×3 IMPLANT
COVER TIP SHEARS 8MM DA VINCI (MISCELLANEOUS) ×2
CUTTER ECHEON FLEX ENDO 45 340 (ENDOMECHANICALS) ×5 IMPLANT
DECANTER SPIKE VIAL GLASS SM (MISCELLANEOUS) ×5 IMPLANT
DRAIN CHANNEL 15F RND FF 3/16 (WOUND CARE) ×5 IMPLANT
DRAPE SURG IRRIG POUCH 19X23 (DRAPES) ×5 IMPLANT
DRSG TEGADERM 4X4.75 (GAUZE/BANDAGES/DRESSINGS) ×10 IMPLANT
DRSG TEGADERM 6X8 (GAUZE/BANDAGES/DRESSINGS) ×15 IMPLANT
ELECT REM PT RETURN 9FT ADLT (ELECTROSURGICAL) ×5
ELECTRODE REM PT RTRN 9FT ADLT (ELECTROSURGICAL) ×3 IMPLANT
GAUZE SPONGE 2X2 8PLY STRL LF (GAUZE/BANDAGES/DRESSINGS) ×3 IMPLANT
GAUZE SPONGE 4X4 12PLY STRL (GAUZE/BANDAGES/DRESSINGS) ×5 IMPLANT
GLOVE BIO SURGEON STRL SZ 6.5 (GLOVE) ×4 IMPLANT
GLOVE BIO SURGEONS STRL SZ 6.5 (GLOVE) ×1
GLOVE BIOGEL M STRL SZ7.5 (GLOVE) ×15 IMPLANT
GOWN STRL REUS W/TWL LRG LVL3 (GOWN DISPOSABLE) ×35 IMPLANT
HOLDER FOLEY CATH W/STRAP (MISCELLANEOUS) ×5 IMPLANT
IV LACTATED RINGERS 1000ML (IV SOLUTION) ×5 IMPLANT
KIT ACCESSORY DA VINCI DISP (KITS) ×2
KIT ACCESSORY DVNC DISP (KITS) ×3 IMPLANT
LIQUID BAND (GAUZE/BANDAGES/DRESSINGS) ×5 IMPLANT
NDL SAFETY ECLIPSE 18X1.5 (NEEDLE) ×3 IMPLANT
NEEDLE HYPO 18GX1.5 SHARP (NEEDLE) ×2
NS IRRIG 1000ML POUR BTL (IV SOLUTION) ×5 IMPLANT
PACK ROBOT UROLOGY CUSTOM (CUSTOM PROCEDURE TRAY) ×5 IMPLANT
POSITIONER SURGICAL ARM (MISCELLANEOUS) ×10 IMPLANT
POUCH ENDO CATCH II 15MM (MISCELLANEOUS) ×5 IMPLANT
POUCH SPECIMEN RETRIEVAL 10MM (ENDOMECHANICALS) ×10 IMPLANT
RELOAD GREEN ECHELON 45 (STAPLE) ×5 IMPLANT
RELOAD LINEAR CUT PROX 55 BLUE (ENDOMECHANICALS) ×10 IMPLANT
RELOAD STAPLER GOLD 60MM (STAPLE) ×3 IMPLANT
RELOAD STAPLER WHITE 60MM (STAPLE) ×6 IMPLANT
SET TUBE IRRIG SUCTION NO TIP (IRRIGATION / IRRIGATOR) ×5 IMPLANT
SHEET LAVH (DRAPES) IMPLANT
SOLUTION ELECTROLUBE (MISCELLANEOUS) ×5 IMPLANT
SPONGE GAUZE 2X2 STER 10/PKG (GAUZE/BANDAGES/DRESSINGS) ×2
SPONGE LAP 18X18 X RAY DECT (DISPOSABLE) ×5 IMPLANT
STAPLE ECHEON FLEX 60 POW ENDO (STAPLE) ×5 IMPLANT
STAPLER GUN LINEAR PROX 60 (STAPLE) ×5 IMPLANT
STAPLER PROXIMATE 55 BLUE (STAPLE) ×5 IMPLANT
STAPLER RELOAD GOLD 60MM (STAPLE) ×5
STAPLER RELOAD WHITE 60MM (STAPLE) ×10
STENT CONTOUR 7FRX24X.038 (STENTS) ×10 IMPLANT
STENT SINGLE 7F (STENTS) IMPLANT
STENT URET 6FRX24 CONTOUR (STENTS) ×5 IMPLANT
SURGIFLO W/THROMBIN 8M KIT (HEMOSTASIS) ×5 IMPLANT
SUT ETHILON 3 0 PS 1 (SUTURE) ×5 IMPLANT
SUT MNCRL 3 0 RB1 (SUTURE) ×3 IMPLANT
SUT MNCRL 3 0 VIOLET RB1 (SUTURE) ×3 IMPLANT
SUT MNCRL AB 4-0 PS2 18 (SUTURE) ×10 IMPLANT
SUT MON AB 2-0 SH 27 (SUTURE) ×2
SUT MON AB 2-0 SH27 (SUTURE) ×3 IMPLANT
SUT MONOCRYL 3 0 RB1 (SUTURE) ×4
SUT PDS AB 1 CTX 36 (SUTURE) ×10 IMPLANT
SUT PDS AB 3-0 SH 27 (SUTURE) IMPLANT
SUT PDS AB 4-0 RB1 27 (SUTURE) ×20 IMPLANT
SUT PDS AB 4-0 SH 27 (SUTURE) ×25 IMPLANT
SUT SILK 0 (SUTURE) ×4
SUT SILK 0 30XBRD TIE 6 (SUTURE) ×6 IMPLANT
SUT SILK 2 0 (SUTURE) ×2
SUT SILK 2 0 SH CR/8 (SUTURE) ×10 IMPLANT
SUT SILK 2-0 30XBRD TIE 12 (SUTURE) ×3 IMPLANT
SUT SILK 3 0 (SUTURE) ×4
SUT SILK 3 0 12 30 (SUTURE) ×5 IMPLANT
SUT SILK 3-0 18XBRD TIE 12 (SUTURE) ×6 IMPLANT
SUT VIC AB 0 CT1 27 (SUTURE) ×12
SUT VIC AB 0 CT1 27XBRD ANTBC (SUTURE) ×18 IMPLANT
SUT VIC AB 0 UR5 27 (SUTURE) ×5 IMPLANT
SUT VIC AB 2-0 SH 27 (SUTURE) ×4
SUT VIC AB 2-0 SH 27X BRD (SUTURE) ×6 IMPLANT
SUT VIC AB 3-0 SH 27 (SUTURE) ×2
SUT VIC AB 3-0 SH 27X BRD (SUTURE) ×3 IMPLANT
SUT VIC AB 4-0 SH 18 (SUTURE) ×5 IMPLANT
SUT VICRYL 0 UR6 27IN ABS (SUTURE) ×20 IMPLANT
SYR 27GX1/2 1ML LL SAFETY (SYRINGE) ×5 IMPLANT
SYR 3ML LL SCALE MARK (SYRINGE) ×5 IMPLANT
SYSTEM UROSTOMY GENTLE TOUCH (WOUND CARE) ×5 IMPLANT
TOWEL OR 17X26 10 PK STRL BLUE (TOWEL DISPOSABLE) ×5 IMPLANT
TOWEL OR NON WOVEN STRL DISP B (DISPOSABLE) ×5 IMPLANT
TROCAR XCEL 12X100 BLDLESS (ENDOMECHANICALS) ×5 IMPLANT
URINEMETER 200ML W/220 (MISCELLANEOUS) ×5 IMPLANT
WATER STERILE IRR 1500ML POUR (IV SOLUTION) ×10 IMPLANT
YANKAUER SUCT BULB TIP 10FT TU (MISCELLANEOUS) ×5 IMPLANT

## 2015-05-22 NOTE — Interval H&P Note (Signed)
History and Physical Interval Note:  05/22/2015 7:25 AM  Curtis Odom  has presented today for surgery, with the diagnosis of PROSTATE CANCER, BLADDER DIVERTICULUM  The various methods of treatment have been discussed with the patient and family. After consideration of risks, benefits and other options for treatment, the patient has consented to  Procedure(s): ROBOTIC ASSISTED LAPAROSCOPIC RADICAL PROSTATECTOMY LEVEL 3 (N/A) ROBOTIC ASSISTED LAPAROSCOPIC BLADDER DIVERTICULECTOMY (N/A) PELVIC LYMPHADENECTOMY (Bilateral) CYSTOSCOPY WITH STENT PLACEMENT (Right) as a surgical intervention .  The patient's history has been reviewed, patient examined, no change in status, stable for surgery.  I have reviewed the patient's chart and labs.  Questions were answered to the patient's satisfaction.     [name],LES

## 2015-05-22 NOTE — Anesthesia Postprocedure Evaluation (Signed)
  Anesthesia Post-op Note  Patient: Curtis Odom  Procedure(s) Performed: Procedure(s) (LRB): ROBOTIC ASSISTED LAPAROSCOPIC RADICAL PROSTATECTOMY LEVEL 3 (N/A) ROBOTIC ASSISTED LAPAROSCOPIC BLADDER DIVERTICULECTOMY (N/A) PELVIC LYMPHADENECTOMY (Bilateral) CYSTOSCOPY WITH STENT PLACEMENT (Right)  Patient Location: PACU  Anesthesia Type: General  Level of Consciousness: awake and alert   Airway and Oxygen Therapy: Patient Spontanous Breathing  Post-op Pain: mild  Post-op Assessment: Post-op Vital signs reviewed, Patient's Cardiovascular Status Stable, Respiratory Function Stable, Patent Airway and No signs of Nausea or vomiting  Last Vitals:  Filed Vitals:   05/22/15 1200  BP: 107/74  Pulse: 56  Temp:   Resp: 19    Post-op Vital Signs: stable   Complications: No apparent anesthesia complications

## 2015-05-22 NOTE — Discharge Instructions (Signed)

## 2015-05-22 NOTE — Transfer of Care (Signed)
Immediate Anesthesia Transfer of Care Note  Patient: Curtis Odom  Procedure(s) Performed: Procedure(s): ROBOTIC ASSISTED LAPAROSCOPIC RADICAL PROSTATECTOMY LEVEL 3 (N/A) ROBOTIC ASSISTED LAPAROSCOPIC BLADDER DIVERTICULECTOMY (N/A) PELVIC LYMPHADENECTOMY (Bilateral) CYSTOSCOPY WITH STENT PLACEMENT (Right)  Patient Location: PACU  Anesthesia Type:General  Level of Consciousness: sedated  Airway & Oxygen Therapy: Patient Spontanous Breathing and Patient connected to face mask oxygen  Post-op Assessment: Report given to RN and Post -op Vital signs reviewed and stable  Post vital signs: Reviewed and stable  Last Vitals:  Filed Vitals:   05/22/15 0513  BP: 153/86  Pulse: 55  Temp: 36.6 C  Resp: 16    Complications: No apparent anesthesia complications

## 2015-05-22 NOTE — Progress Notes (Signed)
Patient ID: Curtis Odom, male   DOB: [birth year], 69 y.o.   MRN: [id number] Post-op note  Subjective: The patient is doing well.  No complaints.  Denies N/V  Objective: Vital signs in last 24 hours: Temp:  [97 F (36.1 C)-97.9 F (36.6 C)] 97.4 F (36.3 C) (08/11 1235) Pulse Rate:  [52-63] 52 (08/11 1251) Resp:  [14-19] 17 (08/11 1235) BP: (102-153)/(57-86) 125/67 mmHg (08/11 1251) SpO2:  [97 %-100 %] 100 % (08/11 1251) Weight:  [83.28 kg (183 lb 9.6 oz)-85.7 kg (188 lb 15 oz)] 85.7 kg (188 lb 15 oz) (08/11 1251)  Intake/Output from previous day:   Intake/Output this shift: Total I/O In: 1940 [I.V.:1900; Other:40] Out: 210 [Urine:60; Drains:50; Blood:100]  Physical Exam:  General: Alert and oriented. Abdomen: Soft, Nondistended. Incisions: Clean and dry. Urine: dark yellow  Lab Results:  Recent Labs  05/22/15 1148  HGB 14.2  HCT 42.1    Assessment/Plan: POD#0   1) Continue to monitor  2) DVT prophy, clears, IS, amb, pain control   LOS: 0 days   Theadora Noyes 05/22/2015, 4:06 PM

## 2015-05-22 NOTE — Anesthesia Preprocedure Evaluation (Signed)
Anesthesia Evaluation  Patient identified by MRN, date of birth, ID band Patient awake    Reviewed: Allergy & Precautions, NPO status , Patient's Chart, lab work & pertinent test results  Airway Mallampati: II  TM Distance: >3 FB Neck ROM: Full    Dental no notable dental hx.    Pulmonary neg pulmonary ROS,  breath sounds clear to auscultation  Pulmonary exam normal       Cardiovascular hypertension, Pt. on medications and Pt. on home beta blockers Normal cardiovascular examRhythm:Regular Rate:Normal     Neuro/Psych negative neurological ROS  negative psych ROS   GI/Hepatic negative GI ROS, Neg liver ROS,   Endo/Other  negative endocrine ROS  Renal/GU negative Renal ROS  negative genitourinary   Musculoskeletal negative musculoskeletal ROS (+)   Abdominal   Peds negative pediatric ROS (+)  Hematology negative hematology ROS (+)   Anesthesia Other Findings   Reproductive/Obstetrics negative OB ROS                             Anesthesia Physical Anesthesia Plan  ASA: II  Anesthesia Plan: General   Post-op Pain Management:    Induction: Intravenous  Airway Management Planned: Oral ETT  Additional Equipment:   Intra-op Plan:   Post-operative Plan: Extubation in OR  Informed Consent: I have reviewed the patients History and Physical, chart, labs and discussed the procedure including the risks, benefits and alternatives for the proposed anesthesia with the patient or authorized representative who has indicated his/her understanding and acceptance.   Dental advisory given  Plan Discussed with: CRNA and Surgeon  Anesthesia Plan Comments:         Anesthesia Quick Evaluation

## 2015-05-22 NOTE — Op Note (Signed)
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 Robotic assisted laparoscopic radical prostatectomy (bilateral nerve sparing) 2. Bilateral robotic assisted laparoscopic pelvic lymphadenectomy 3. Cystoscopy 4. Right ureteral stent (6 x 24) 5. Robotic assisted laparoscopic bladder diverticulectomy  Surgeon: Pryor Curia. M.D.  Assistant:Amanda Dancy, PA-C  Anesthesia: General  Complications: None  EBL: 100 mL  IVF:  1500 mL crystalloid  Specimens: 1. Prostate and seminal vesicles 2. Right pelvic lymph nodes 3. Left pelvic lymph nodes  Disposition of specimens: Pathology  Drains: 1. 20 Fr coude catheter 2. # 19 Blake pelvic drain  Indication: Curtis Odom is a 69 y.o. year old patient with clinically localized prostate cancer and a large bladder diverticulum.  After a thorough review of the management options for treatment of prostate cancer, he elected to proceed with surgical therapy and the above procedure(s). He also elected to proceed with diverticulectomy.  We have discussed the potential benefits and risks of the procedure, side effects of the proposed treatment, the likelihood of the patient achieving the goals of the procedure, and any potential problems that might occur during the procedure or recuperation. Informed consent has been obtained.  Description of procedure:  The patient was taken to the operating room and a general anesthetic was administered. He was given preoperative antibiotics, placed in the dorsal lithotomy position, and prepped and draped in the usual sterile fashion. Next a preoperative timeout was performed.  Cystourethroscopy was then performed revealing a normal urethra with a high bladder neck but otherwise unremarkable.   Inspection of the bladder revealed no bladder tumors or stones.  The left ureteral orifice was in its expected location.  On the right side just lateral to the trigonal ridge was a large 5-6 cm bladder diverticulum.  This was inspected with no tumors or other abnormalities noted.  The right ureteral orifice was located just at the lateral edge of the diverticular neck.  The orifice was cannulated with a 0.38 sensor guide wire which was inserted up into the renal pelvis under fluoroscopic guidance. A 6 x 24 ureteral stent was then advanced over the wire and positioned under fluoroscopic and cystoscopic guidance.  The wire was removed and appropriate stent position was confirmed with fluoroscopy.  The patient was then prepped again with both his genitalia and abdomen prepped.  Another time out was performed. A urethral catheter was placed into the bladder and a site was selected near the umbilicus for placement of the camera port. This was placed using a standard open Hassan technique which allowed entry into the peritoneal cavity under direct vision and without difficulty. A 12 mm port was placed and a pneumoperitoneum established. The camera was then used to inspect the abdomen and there was no evidence of any intra-abdominal injuries or other abnormalities. The remaining abdominal ports were then placed. 8 mm robotic ports were placed in the right lower quadrant, left lower quadrant, and far left lateral abdominal wall. A 5 mm port was placed in the right upper quadrant and a 12 mm port was placed in the right lateral abdominal wall for laparoscopic assistance. All ports were placed under direct vision without difficulty. The surgical cart was then docked.   Using cautery scissors, the bladder was mobilized on the right side as the peritoneum was incised along the lateral edge of the medial umbilical  ligament.  Dissection was carried down to the vas deferens which was followed medially where it crossed the  ureter.  The ureter was easily identified.  The bladder was retracted to the left and superiorly and the bladder diverticulum was able to be identified just lateral to the ureter.  Using careful dissection with a combination of blunt, sharp, and cautery dissection, the bladder diverticulum was dissected free from the surrounding perivesical tissues.  This left the neck of the diverticulum with the ureter entering the bladder just medial to the neck.  It was decided that a small flap of the diverticulum could be used to sew close the bladder to avoid ureteral reimplantation.  The wall of the diverticulum was robust tissue.  The diverticulum was then opened away from the ureter and the urothelium was examined.  The majority of the diverticulum was excised with a small flap of the diverticular tissue left lateral to the ureter.  The bladder opening was then closed with a 3-0 V lock suture closing the bladder wall to the flap of the diverticulum.  This appeared to preserve the ureter without kinking the ureter at all. The bladder was then filled with 300 cc of saline with no leakage noted.  Utilizing the cautery scissors, the bladder was reflected posteriorly allowing entry into the space of Retzius and identification of the endopelvic fascia and prostate. The periprostatic fat was then removed from the prostate allowing full exposure of the endopelvic fascia. The endopelvic fascia was then incised from the apex back to the base of the prostate bilaterally and the underlying levator muscle fibers were swept laterally off the prostate thereby isolating the dorsal venous complex. The dorsal vein was then stapled and divided with a 45 mm Flex Echelon stapler. Attention then turned to the bladder neck which was divided anteriorly thereby allowing entry into the bladder and exposure of the urethral catheter. The catheter balloon was deflated and the catheter was brought into the operative field and used to retract the  prostate anteriorly. The posterior bladder neck was then examined and was divided allowing further dissection between the bladder and prostate posteriorly until the vasa deferentia and seminal vessels were identified. The vasa deferentia were isolated, divided, and lifted anteriorly. The seminal vesicles were dissected down to their tips with care to control the seminal vascular arterial blood supply. These structures were then lifted anteriorly and the space between Denonvillier's fascia and the anterior rectum was developed with a combination of sharp and blunt dissection. This isolated the vascular pedicles of the prostate.  The lateral prostatic fascia was then sharply incised allowing release of the neurovascular bundles bilaterally. The vascular pedicles of the prostate were then ligated with Weck clips between the prostate and neurovascular bundles and divided with sharp cold scissor dissection resulting in neurovascular bundle preservation. The neurovascular bundles were then separated off the apex of the prostate and urethra bilaterally.  The urethra was then sharply transected allowing the prostate specimen to be disarticulated. The pelvis was copiously irrigated and hemostasis was ensured. There was no evidence for rectal injury.  Attention then turned to the right pelvic sidewall. The fibrofatty tissue between the external iliac vein, confluence of the iliac vessels, hypogastric artery, and Cooper's ligament was dissected free from the pelvic sidewall with care to preserve the obturator nerve. Weck clips were used for lymphostasis and hemostasis. An identical procedure was performed on the contralateral side and the lymphatic packets were removed for permanent pathologic analysis.  Attention then turned  to the urethral anastomosis. A 2-0 Vicryl slip knot was placed between Denonvillier's fascia, the posterior bladder neck, and the posterior urethra to reapproximate these structures. A  double-armed 3-0 Monocryl suture was then used to perform a 360 running tension-free anastomosis between the bladder neck and urethra. A new urethral catheter was then placed into the bladder and irrigated. There were no blood clots within the bladder and the anastomosis appeared to be watertight. A #19 Blake drain was then brought through the left lateral 8 mm port site and positioned appropriately within the pelvis. It was secured to the skin with a nylon suture. The surgical cart was then undocked. The right lateral 12 mm port site was closed at the fascial level with a 0 Vicryl suture placed laparoscopically. All remaining ports were then removed under direct vision. The prostate specimen was removed intact within the Endopouch retrieval bag via the periumbilical camera port site. This fascial opening was closed with two running 0 Vicryl sutures. 0.25% Marcaine was then injected into all port sites and all incisions were reapproximated at the skin level with 4-0 Monocryl subcuticular sutures and Dermabond. The patient appeared to tolerate the procedure well and without complications. The patient was able to be extubated and transferred to the recovery unit in satisfactory condition.   Pryor Curia MD

## 2015-05-22 NOTE — Anesthesia Procedure Notes (Signed)
Procedure Name: Intubation Date/Time: 05/22/2015 7:27 AM Performed by: Lind Covert Pre-anesthesia Checklist: Patient identified, Emergency Drugs available, Suction available, Patient being monitored and Timeout performed Patient Re-evaluated:Patient Re-evaluated prior to inductionOxygen Delivery Method: Circle system utilized Preoxygenation: Pre-oxygenation with 100% oxygen Intubation Type: IV induction Ventilation: Mask ventilation without difficulty Laryngoscope Size: Mac and 4 Grade View: Grade I Tube type: Oral Tube size: 7.5 mm Number of attempts: 1 Airway Equipment and Method: Stylet Placement Confirmation: ETT inserted through vocal cords under direct vision,  positive ETCO2 and breath sounds checked- equal and bilateral Secured at: 22 cm Tube secured with: Tape Dental Injury: Teeth and Oropharynx as per pre-operative assessment

## 2015-05-22 NOTE — Progress Notes (Signed)
Utilization review completed.  

## 2015-05-23 ENCOUNTER — Encounter (HOSPITAL_COMMUNITY): Payer: Self-pay | Admitting: Urology

## 2015-05-23 LAB — HEMOGLOBIN AND HEMATOCRIT, BLOOD
HCT: 36.4 % — ABNORMAL LOW (ref 39.0–52.0)
Hemoglobin: 12 g/dL — ABNORMAL LOW (ref 13.0–17.0)

## 2015-05-23 MED ORDER — BISACODYL 10 MG RE SUPP
10.0000 mg | Freq: Once | RECTAL | Status: AC
  Administered 2015-05-23: 10 mg via RECTAL
  Filled 2015-05-23: qty 1

## 2015-05-23 MED ORDER — HYDROCODONE-ACETAMINOPHEN 5-325 MG PO TABS: 1.0000 | ORAL_TABLET | Freq: Four times a day (QID) | ORAL | Status: DC | PRN

## 2015-05-23 NOTE — Discharge Summary (Signed)
  Date of admission: 05/22/2015  Date of discharge: 05/23/2015  Admission diagnosis: Prostate Cancer  Discharge diagnosis: Prostate Cancer  History and Physical: For full details, please see admission history and physical. Briefly, Curtis Odom is a 69 y.o. gentleman with localized prostate cancer.  After discussing management/treatment options, he elected to proceed with surgical treatment.  Hospital Course: Curtis Odom was taken to the operating room on 05/22/2015 and underwent a robotic assisted laparoscopic radical prostatectomy. He tolerated this procedure well and without complications. Postoperatively, he was able to be transferred to a regular hospital room following recovery from anesthesia.  He was able to begin ambulating the night of surgery. He remained hemodynamically stable overnight.  He had excellent urine output with appropriately minimal output from his pelvic drain and his pelvic drain was removed on POD #1.  He was transitioned to oral pain medication, tolerated a clear liquid diet, and had met all discharge criteria and was able to be discharged home later on POD#1.  Laboratory values:  Recent Labs  05/22/15 1148 05/23/15 0453  HGB 14.2 12.0*  HCT 42.1 36.4*    Disposition: Home  Discharge instruction: He was instructed to be ambulatory but to refrain from heavy lifting, strenuous activity, or driving. He was instructed on urethral catheter care.  Discharge medications:     Medication List    STOP taking these medications        acidophilus Caps capsule      TAKE these medications        acetaminophen 500 MG tablet  Commonly known as:  TYLENOL  Take 1,000 mg by mouth every 6 (six) hours as needed for mild pain.     HYDROcodone-acetaminophen 5-325 MG per tablet  Commonly known as:  NORCO  Take 1-2 tablets by mouth every 6 (six) hours as needed.     metoprolol 50 MG tablet  Commonly known as:  LOPRESSOR  Take 25 mg by mouth 2 (two) times daily.      sulfamethoxazole-trimethoprim 800-160 MG per tablet  Commonly known as:  BACTRIM DS,SEPTRA DS  Take 1 tablet by mouth 2 (two) times daily. Start the day prior to foley removal appointment        Followup: He will followup in 1 week for catheter removal and to discuss his surgical pathology results.

## 2015-05-23 NOTE — Care Management Note (Signed)
Case Management Note  Patient Details  Name: Curtis Odom MRN: [id number] Date of Birth: Sep 15, 1946  Subjective/Objective:  69 y/o m admitted w/Prostate Ca. S/p lap prostatectomy. From home.                  Action/Plan:d/c home no needs or roders.   Expected Discharge Date:                 Expected Discharge Plan:  Home/Self Care  In-House Referral:     Discharge planning Services  CM Consult  Post Acute Care Choice:    Choice offered to:     DME Arranged:    DME Agency:     HH Arranged:    [place] Agency:     Status of Service:  Completed, signed off  Medicare Important Message Given:    Date Medicare IM Given:    Medicare IM give by:    Date Additional Medicare IM Given:    Additional Medicare Important Message give by:     If discussed at [place] of Stay Meetings, dates discussed:    Additional Comments:  Dessa Phi, RN 05/23/2015, 2:56 PM

## 2015-05-23 NOTE — Progress Notes (Signed)
Patient ID: Curtis Odom, male   DOB: 1946-06-06, 69 y.o.   MRN: [id number]  1 Day Post-Op Subjective: The patient is doing well.  No nausea or vomiting. Pain is adequately controlled.  Objective: Vital signs in last 24 hours: Temp:  [97 F (36.1 C)-98.5 F (36.9 C)] 98.1 F (36.7 C) (08/12 0427) Pulse Rate:  [52-82] 65 (08/12 0427) Resp:  [14-19] 18 (08/12 0427) BP: (102-143)/(54-74) 114/62 mmHg (08/12 0427) SpO2:  [94 %-100 %] 97 % (08/12 0427) Weight:  [85.7 kg (188 lb 15 oz)] 85.7 kg (188 lb 15 oz) (08/11 1251)  Intake/Output from previous day: 08/11 0701 - 08/12 0700 In: 3600 [P.O.:480; I.V.:2990; IV Piggyback:50] Out: 1308 [Urine:2360; Drains:80; Blood:100] Intake/Output this shift: Total I/O In: -  Out: 2030 [Urine:2000; Drains:30]  Physical Exam:  General: Alert and oriented. CV: RRR Lungs: Clear bilaterally. GI: Soft, Nondistended. Incisions: Clean, dry, and intact Urine: Clear Extremities: Nontender, no erythema, no edema.  Lab Results:  Recent Labs  05/22/15 1148 05/23/15 0453  HGB 14.2 12.0*  HCT 42.1 36.4*      Assessment/Plan: POD# 1 s/p robotic prostatectomy and bladder diverticulectomy.  1) SL IVF 2) Ambulate, Incentive spirometry 3) Transition to oral pain medication 4) Dulcolax suppository 5) D/C pelvic drain 6) Recheck H/H 7) Plan for likely discharge later today   Pryor Curia. MD   LOS: 1 day   Crystalyn Delia,LES 05/23/2015, 6:51 AM

## 2015-05-30 DIAGNOSIS — N323 Diverticulum of bladder: Secondary | ICD-10-CM | POA: Diagnosis not present

## 2015-05-30 DIAGNOSIS — C61 Malignant neoplasm of prostate: Secondary | ICD-10-CM | POA: Diagnosis not present

## 2015-06-23 DIAGNOSIS — N393 Stress incontinence (female) (male): Secondary | ICD-10-CM | POA: Diagnosis not present

## 2015-06-23 DIAGNOSIS — R278 Other lack of coordination: Secondary | ICD-10-CM | POA: Diagnosis not present

## 2015-06-23 DIAGNOSIS — M6281 Muscle weakness (generalized): Secondary | ICD-10-CM | POA: Diagnosis not present

## 2015-07-10 DIAGNOSIS — M6281 Muscle weakness (generalized): Secondary | ICD-10-CM | POA: Diagnosis not present

## 2015-07-10 DIAGNOSIS — N393 Stress incontinence (female) (male): Secondary | ICD-10-CM | POA: Diagnosis not present

## 2015-07-10 DIAGNOSIS — R278 Other lack of coordination: Secondary | ICD-10-CM | POA: Diagnosis not present

## 2015-07-10 DIAGNOSIS — C61 Malignant neoplasm of prostate: Secondary | ICD-10-CM | POA: Diagnosis not present

## 2015-07-16 DIAGNOSIS — C61 Malignant neoplasm of prostate: Secondary | ICD-10-CM | POA: Diagnosis not present

## 2015-07-16 DIAGNOSIS — N323 Diverticulum of bladder: Secondary | ICD-10-CM | POA: Diagnosis not present

## 2015-07-30 DIAGNOSIS — M6281 Muscle weakness (generalized): Secondary | ICD-10-CM | POA: Diagnosis not present

## 2015-07-30 DIAGNOSIS — N393 Stress incontinence (female) (male): Secondary | ICD-10-CM | POA: Diagnosis not present

## 2015-07-30 DIAGNOSIS — R278 Other lack of coordination: Secondary | ICD-10-CM | POA: Diagnosis not present

## 2015-08-27 DIAGNOSIS — R278 Other lack of coordination: Secondary | ICD-10-CM | POA: Diagnosis not present

## 2015-08-27 DIAGNOSIS — M6281 Muscle weakness (generalized): Secondary | ICD-10-CM | POA: Diagnosis not present

## 2015-08-27 DIAGNOSIS — N393 Stress incontinence (female) (male): Secondary | ICD-10-CM | POA: Diagnosis not present

## 2015-11-19 DIAGNOSIS — C4441 Basal cell carcinoma of skin of scalp and neck: Secondary | ICD-10-CM | POA: Diagnosis not present

## 2015-11-28 DIAGNOSIS — C61 Malignant neoplasm of prostate: Secondary | ICD-10-CM | POA: Diagnosis not present

## 2015-12-05 DIAGNOSIS — N5201 Erectile dysfunction due to arterial insufficiency: Secondary | ICD-10-CM | POA: Diagnosis not present

## 2015-12-05 DIAGNOSIS — Z0000 Encounter for general adult medical examination without abnormal findings: Secondary | ICD-10-CM | POA: Diagnosis not present

## 2015-12-05 DIAGNOSIS — C61 Malignant neoplasm of prostate: Secondary | ICD-10-CM | POA: Diagnosis not present

## 2015-12-05 DIAGNOSIS — N393 Stress incontinence (female) (male): Secondary | ICD-10-CM | POA: Diagnosis not present

## 2016-03-17 DIAGNOSIS — E782 Mixed hyperlipidemia: Secondary | ICD-10-CM | POA: Diagnosis not present

## 2016-03-17 DIAGNOSIS — Z8546 Personal history of malignant neoplasm of prostate: Secondary | ICD-10-CM | POA: Diagnosis not present

## 2016-03-17 DIAGNOSIS — I10 Essential (primary) hypertension: Secondary | ICD-10-CM | POA: Diagnosis not present

## 2016-03-17 DIAGNOSIS — J309 Allergic rhinitis, unspecified: Secondary | ICD-10-CM | POA: Diagnosis not present

## 2016-03-17 DIAGNOSIS — Z0000 Encounter for general adult medical examination without abnormal findings: Secondary | ICD-10-CM | POA: Diagnosis not present

## 2016-03-17 DIAGNOSIS — K635 Polyp of colon: Secondary | ICD-10-CM | POA: Diagnosis not present

## 2016-05-19 IMAGING — NM NM BONE WHOLE BODY
2 series · 2 of 2 positions shown · non-contrast
Comparison: CT 04/03/2015 .

CLINICAL DATA: Prostate cancer.

EXAM:
NUCLEAR MEDICINE WHOLE BODY BONE SCAN
TECHNIQUE: Whole body anterior and posterior images were obtained approximately
3 hours after intravenous injection of radiopharmaceutical.
RADIOPHARMACEUTICALS:  Xechnetium-BBm MDP IV

[Series 1: wbr_bone_40 whole body · 2.66mm/px · 1 of 1 slices shown (1 of 2)]
[im 1/1]
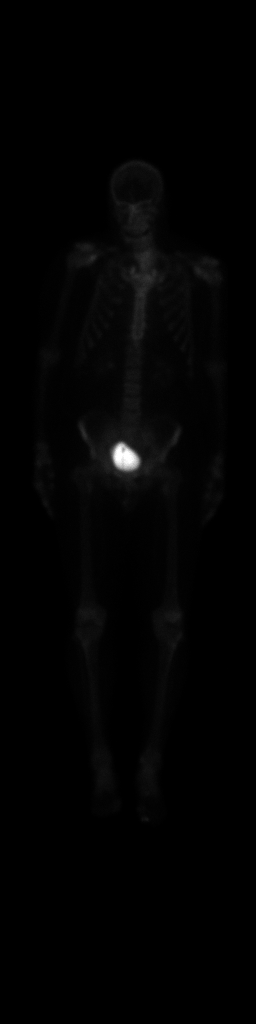

[Series 1: wbr_bone_40 whole body · 2.66mm/px · 1 of 1 slices shown (2 of 2)]
[im 1/1]
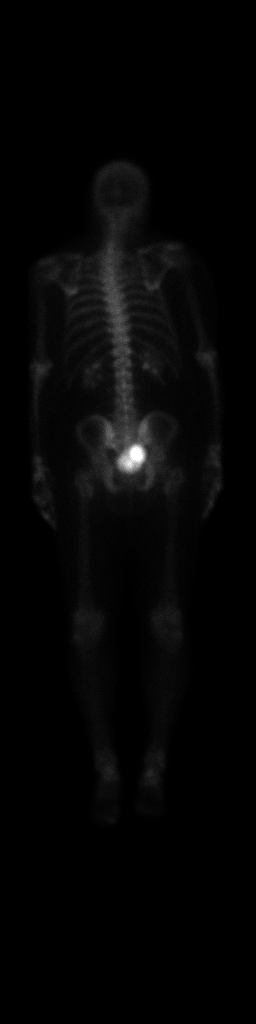

[2 of 2 positions shown; findings below may reference images not displayed]

FINDINGS: Bilateral renal function excretion. Bladder diverticulum again
noted. No focal bony abnormalities noted to suggest metastatic
disease.
IMPRESSION: No evidence of metastatic disease.

## 2016-06-21 DIAGNOSIS — L821 Other seborrheic keratosis: Secondary | ICD-10-CM | POA: Diagnosis not present

## 2016-06-21 DIAGNOSIS — Z85828 Personal history of other malignant neoplasm of skin: Secondary | ICD-10-CM | POA: Diagnosis not present

## 2016-06-21 DIAGNOSIS — Z08 Encounter for follow-up examination after completed treatment for malignant neoplasm: Secondary | ICD-10-CM | POA: Diagnosis not present

## 2016-06-21 DIAGNOSIS — L570 Actinic keratosis: Secondary | ICD-10-CM | POA: Diagnosis not present

## 2016-06-23 IMAGING — CR DG CHEST 2V
2 series · 2 of 2 positions shown · non-contrast
Comparison: None in PACs

CLINICAL DATA: Preoperative examination prior prostatectomy next
month

EXAM:
CHEST  2 VIEW

[w chest pa]
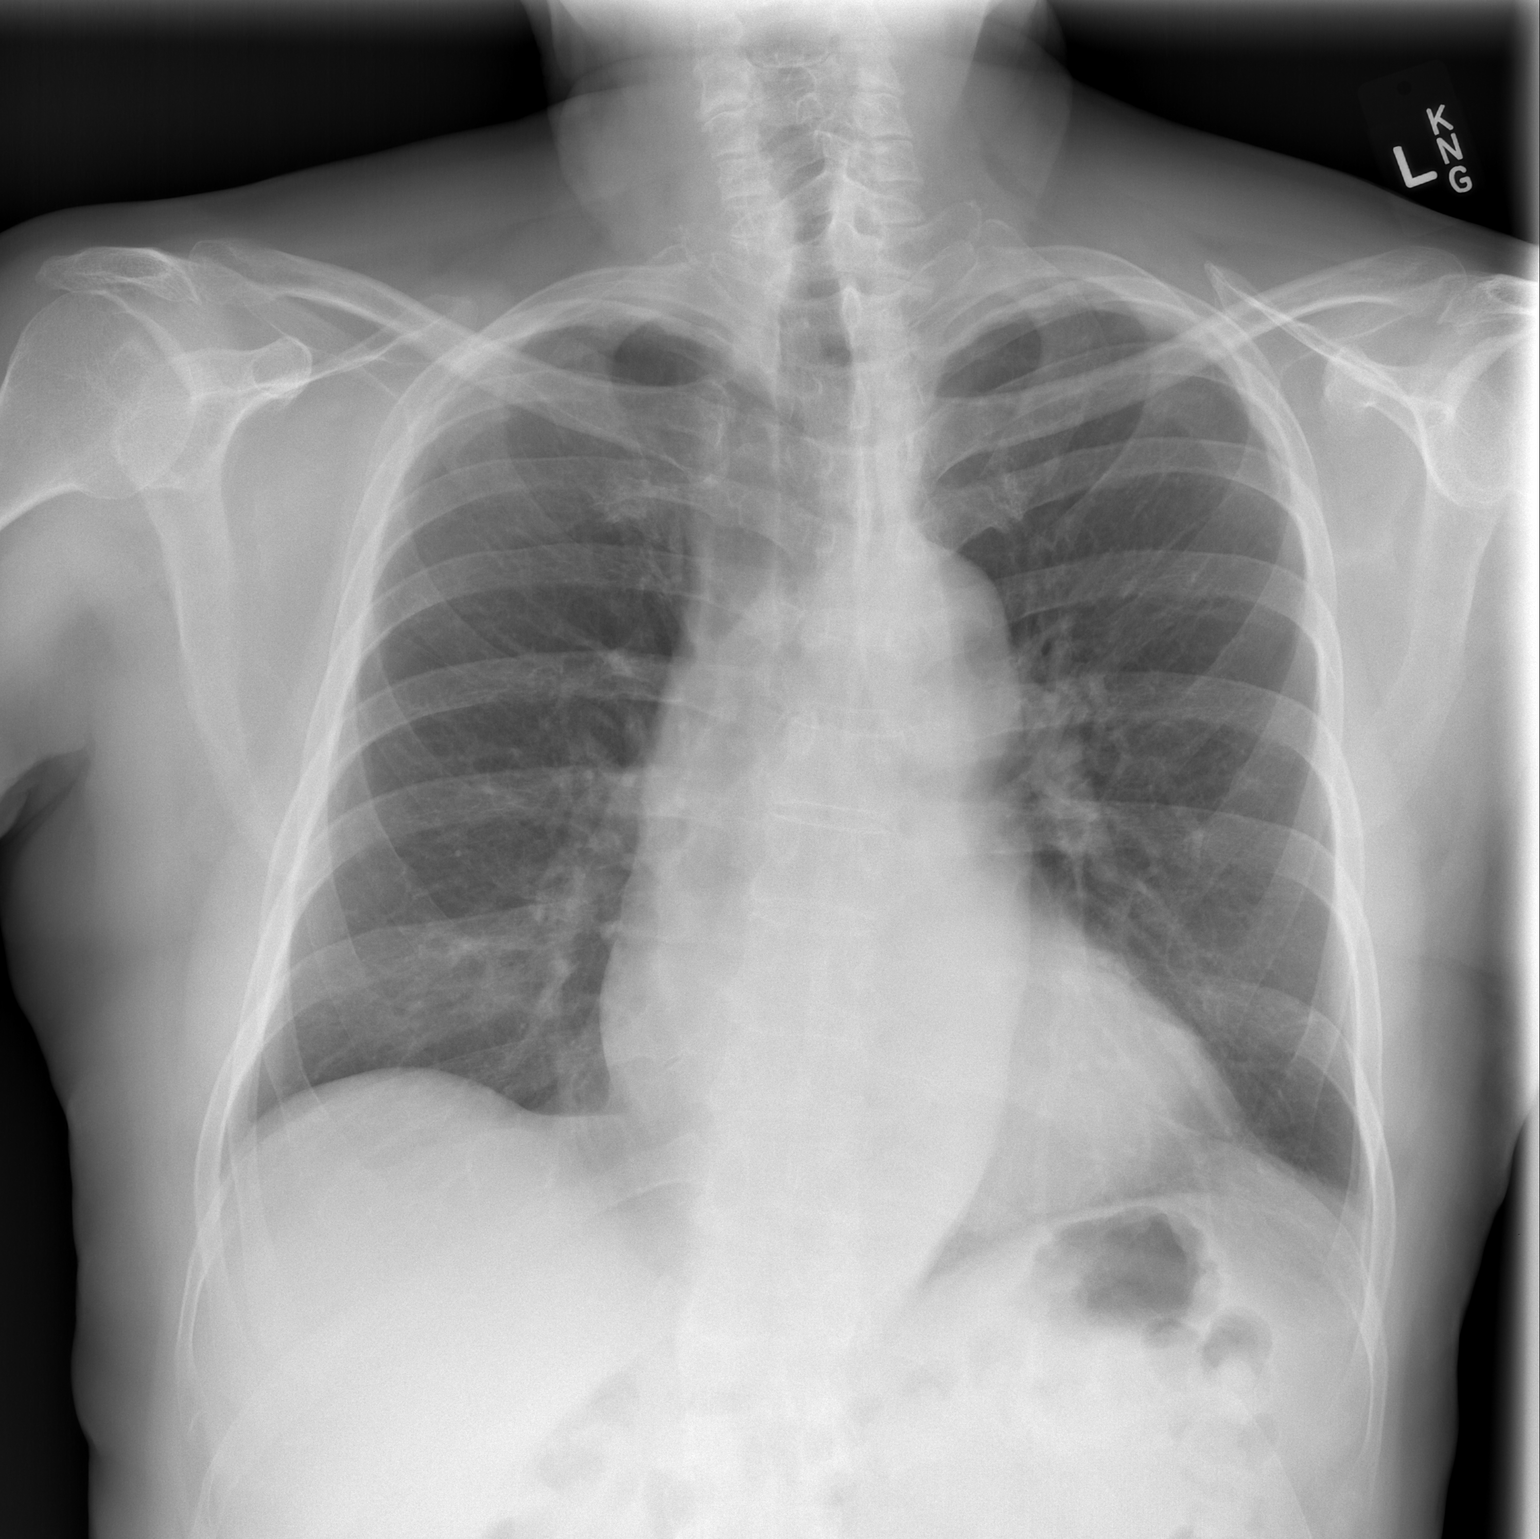

[w chest lat]
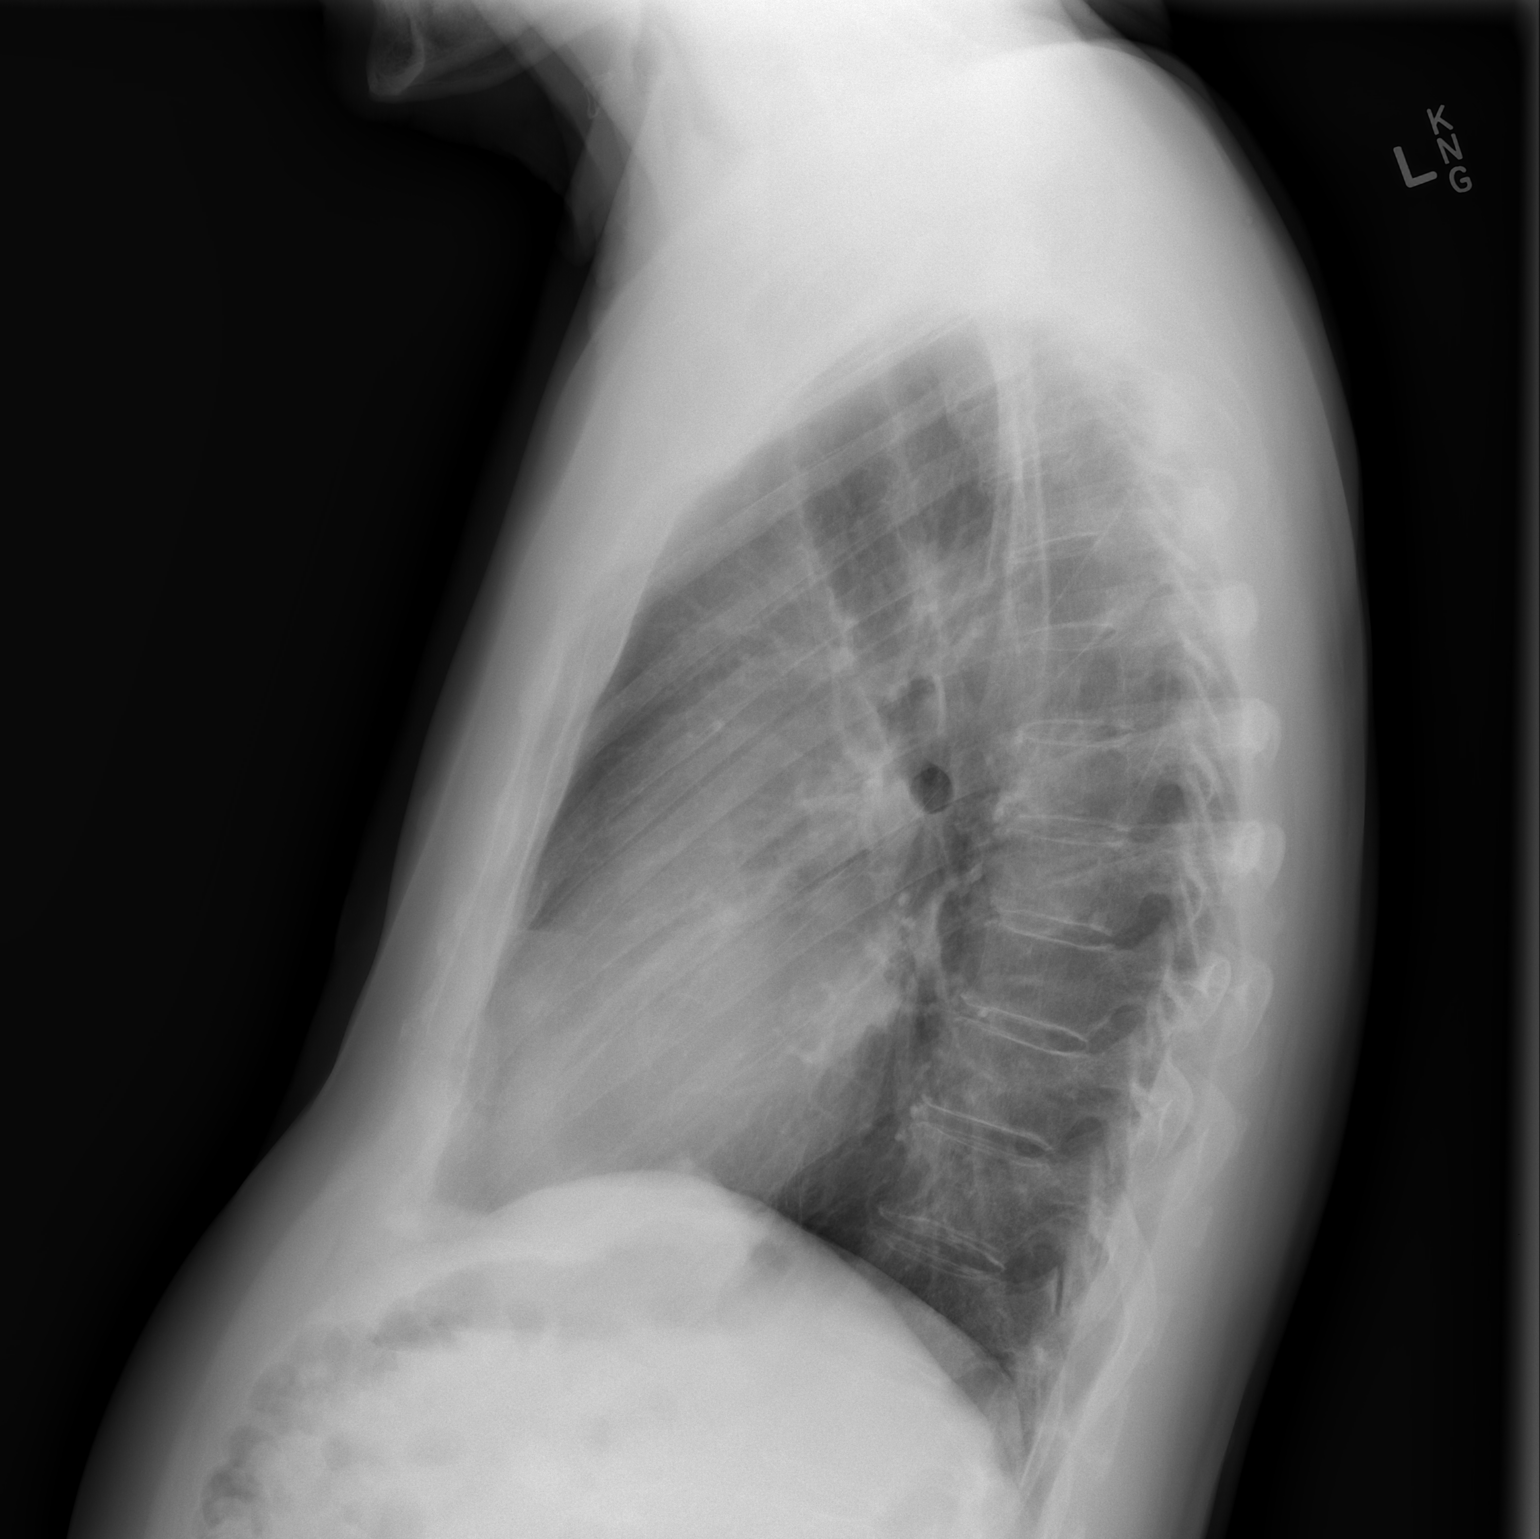

[2 of 2 positions shown; findings below may reference images not displayed]

FINDINGS: The lungs are adequately inflated. There is no focal infiltrate.
There is a small amount of apical pleural thickening bilaterally.
There is no pleural effusion. The heart and pulmonary vascularity
are normal. The mediastinum is normal in width. There is mild
tortuosity of the descending thoracic aorta. The bony thorax
exhibits no lytic or blastic lesion.
IMPRESSION: There is no active cardiopulmonary disease.

## 2016-07-02 DIAGNOSIS — C61 Malignant neoplasm of prostate: Secondary | ICD-10-CM | POA: Diagnosis not present

## 2016-07-09 DIAGNOSIS — N5201 Erectile dysfunction due to arterial insufficiency: Secondary | ICD-10-CM | POA: Diagnosis not present

## 2016-07-09 DIAGNOSIS — C61 Malignant neoplasm of prostate: Secondary | ICD-10-CM | POA: Diagnosis not present

## 2016-07-27 DIAGNOSIS — I10 Essential (primary) hypertension: Secondary | ICD-10-CM | POA: Diagnosis not present

## 2016-07-27 DIAGNOSIS — R7989 Other specified abnormal findings of blood chemistry: Secondary | ICD-10-CM | POA: Diagnosis not present

## 2016-12-20 DIAGNOSIS — L821 Other seborrheic keratosis: Secondary | ICD-10-CM | POA: Diagnosis not present

## 2016-12-20 DIAGNOSIS — Z85828 Personal history of other malignant neoplasm of skin: Secondary | ICD-10-CM | POA: Diagnosis not present

## 2016-12-20 DIAGNOSIS — Z08 Encounter for follow-up examination after completed treatment for malignant neoplasm: Secondary | ICD-10-CM | POA: Diagnosis not present

## 2017-01-06 DIAGNOSIS — C61 Malignant neoplasm of prostate: Secondary | ICD-10-CM | POA: Diagnosis not present

## 2017-01-19 DIAGNOSIS — C61 Malignant neoplasm of prostate: Secondary | ICD-10-CM | POA: Diagnosis not present

## 2017-01-19 DIAGNOSIS — N5201 Erectile dysfunction due to arterial insufficiency: Secondary | ICD-10-CM | POA: Diagnosis not present

## 2017-03-30 DIAGNOSIS — Z0000 Encounter for general adult medical examination without abnormal findings: Secondary | ICD-10-CM | POA: Diagnosis not present

## 2017-03-30 DIAGNOSIS — I10 Essential (primary) hypertension: Secondary | ICD-10-CM | POA: Diagnosis not present

## 2017-03-30 DIAGNOSIS — E782 Mixed hyperlipidemia: Secondary | ICD-10-CM | POA: Diagnosis not present

## 2017-03-30 DIAGNOSIS — Z8546 Personal history of malignant neoplasm of prostate: Secondary | ICD-10-CM | POA: Diagnosis not present

## 2017-03-30 DIAGNOSIS — Z86010 Personal history of colonic polyps: Secondary | ICD-10-CM | POA: Diagnosis not present

## 2017-06-23 DIAGNOSIS — C61 Malignant neoplasm of prostate: Secondary | ICD-10-CM | POA: Diagnosis not present

## 2017-07-08 DIAGNOSIS — N5201 Erectile dysfunction due to arterial insufficiency: Secondary | ICD-10-CM | POA: Diagnosis not present

## 2017-07-08 DIAGNOSIS — C61 Malignant neoplasm of prostate: Secondary | ICD-10-CM | POA: Diagnosis not present

## 2017-07-13 DIAGNOSIS — L821 Other seborrheic keratosis: Secondary | ICD-10-CM | POA: Diagnosis not present

## 2017-07-13 DIAGNOSIS — Z08 Encounter for follow-up examination after completed treatment for malignant neoplasm: Secondary | ICD-10-CM | POA: Diagnosis not present

## 2017-07-13 DIAGNOSIS — Z85828 Personal history of other malignant neoplasm of skin: Secondary | ICD-10-CM | POA: Diagnosis not present

## 2017-11-21 DIAGNOSIS — K5730 Diverticulosis of large intestine without perforation or abscess without bleeding: Secondary | ICD-10-CM | POA: Diagnosis not present

## 2017-11-21 DIAGNOSIS — Z86010 Personal history of colonic polyps: Secondary | ICD-10-CM | POA: Diagnosis not present

## 2018-01-04 DIAGNOSIS — C61 Malignant neoplasm of prostate: Secondary | ICD-10-CM | POA: Diagnosis not present

## 2018-01-13 DIAGNOSIS — N5201 Erectile dysfunction due to arterial insufficiency: Secondary | ICD-10-CM | POA: Diagnosis not present

## 2018-01-13 DIAGNOSIS — C61 Malignant neoplasm of prostate: Secondary | ICD-10-CM | POA: Diagnosis not present

## 2018-01-13 DIAGNOSIS — R3121 Asymptomatic microscopic hematuria: Secondary | ICD-10-CM | POA: Diagnosis not present

## 2018-03-29 DIAGNOSIS — I10 Essential (primary) hypertension: Secondary | ICD-10-CM | POA: Diagnosis not present

## 2018-05-08 DIAGNOSIS — R310 Gross hematuria: Secondary | ICD-10-CM | POA: Diagnosis not present

## 2018-05-08 DIAGNOSIS — N200 Calculus of kidney: Secondary | ICD-10-CM | POA: Diagnosis not present

## 2018-05-10 DIAGNOSIS — Z8546 Personal history of malignant neoplasm of prostate: Secondary | ICD-10-CM | POA: Diagnosis not present

## 2018-05-10 DIAGNOSIS — Z0000 Encounter for general adult medical examination without abnormal findings: Secondary | ICD-10-CM | POA: Diagnosis not present

## 2018-05-10 DIAGNOSIS — E782 Mixed hyperlipidemia: Secondary | ICD-10-CM | POA: Diagnosis not present

## 2018-05-10 DIAGNOSIS — Z86010 Personal history of colonic polyps: Secondary | ICD-10-CM | POA: Diagnosis not present

## 2018-05-10 DIAGNOSIS — I10 Essential (primary) hypertension: Secondary | ICD-10-CM | POA: Diagnosis not present

## 2018-05-10 DIAGNOSIS — Z1389 Encounter for screening for other disorder: Secondary | ICD-10-CM | POA: Diagnosis not present

## 2018-05-24 ENCOUNTER — Other Ambulatory Visit: Payer: Self-pay | Admitting: Urology

## 2018-05-29 ENCOUNTER — Other Ambulatory Visit (HOSPITAL_COMMUNITY): Payer: Self-pay | Admitting: Urology

## 2018-05-29 DIAGNOSIS — N200 Calculus of kidney: Secondary | ICD-10-CM

## 2018-06-20 DIAGNOSIS — R8271 Bacteriuria: Secondary | ICD-10-CM | POA: Diagnosis not present

## 2018-06-20 DIAGNOSIS — N200 Calculus of kidney: Secondary | ICD-10-CM | POA: Diagnosis not present

## 2018-06-27 NOTE — Patient Instructions (Addendum)
Curtis Odom  06/27/2018   Your procedure is scheduled on: 07-03-18   Report to [facility] Main  Entrance   Report to admitting at      0800 AM       Call this number if you have problems the morning of surgery [phone]    Remember: Do not eat food or drink liquids :After Midnight.   BRUSH YOUR TEETH MORNING OF SURGERY AND RINSE YOUR MOUTH OUT, NO CHEWING GUM CANDY OR MINTS.     Take these medicines the morning of surgery with A SIP OF WATER: metoprolol                                You may not have any metal on your body including hair pins and              piercings  Do not wear jewelry, , lotions, powders or perfumes, deodorant                    Men may shave face and neck.   Do not bring valuables to the hospital. [place].  Contacts, dentures or bridgework may not be worn into surgery.  Leave suitcase in the car. After surgery it may be brought to your room.                Please read over the following fact sheets you were given: _____________________________________________________________________           [facility] - Preparing for Surgery Before surgery, you can play an important role.  Because skin is not sterile, your skin needs to be as free of germs as possible.  You can reduce the number of germs on your skin by washing with CHG (chlorahexidine gluconate) soap before surgery.  CHG is an antiseptic cleaner which kills germs and bonds with the skin to continue killing germs even after washing. Please DO NOT use if you have an allergy to CHG or antibacterial soaps.  If your skin becomes reddened/irritated stop using the CHG and inform your nurse when you arrive at Short Stay. Do not shave (including legs and underarms) for at least 48 hours prior to the first CHG shower.  You may shave your face/neck. Please follow these instructions carefully:  1.  Shower with CHG Soap the night  before surgery and the  morning of Surgery.  2.  If you choose to wash your hair, wash your hair first as usual with your  normal  shampoo.  3.  After you shampoo, rinse your hair and body thoroughly to remove the  shampoo.                           4.  Use CHG as you would any other liquid soap.  You can apply chg directly  to the skin and wash                       Gently with a scrungie or clean washcloth.  5.  Apply the CHG Soap to your body ONLY FROM THE NECK DOWN.   Do not use on face/ open  Wound or open sores. Avoid contact with eyes, ears mouth and genitals (private parts).                       Wash face,  Genitals (private parts) with your normal soap.             6.  Wash thoroughly, paying special attention to the area where your surgery  will be performed.  7.  Thoroughly rinse your body with warm water from the neck down.  8.  DO NOT shower/wash with your normal soap after using and rinsing off  the CHG Soap.                9.  Pat yourself dry with a clean towel.            10.  Wear clean pajamas.            11.  Place clean sheets on your bed the night of your first shower and do not  sleep with pets. Day of Surgery : Do not apply any lotions/deodorants the morning of surgery.  Please wear clean clothes to the hospital/surgery center.  FAILURE TO FOLLOW THESE INSTRUCTIONS MAY RESULT IN THE CANCELLATION OF YOUR SURGERY PATIENT SIGNATURE_________________________________  NURSE SIGNATURE__________________________________  ________________________________________________________________________  WHAT IS A BLOOD TRANSFUSION? Blood Transfusion Information  A transfusion is the replacement of blood or some of its parts. Blood is made up of multiple cells which provide different functions.  Red blood cells carry oxygen and are used for blood loss replacement.  White blood cells fight against infection.  Platelets control bleeding.  Plasma helps clot  blood.  Other blood products are available for specialized needs, such as hemophilia or other clotting disorders. BEFORE THE TRANSFUSION  Who gives blood for transfusions?   Healthy volunteers who are fully evaluated to make sure their blood is safe. This is blood bank blood. Transfusion therapy is the safest it has ever been in the practice of medicine. Before blood is taken from a donor, a complete history is taken to make sure that person has no history of diseases nor engages in risky social behavior (examples are intravenous drug use or sexual activity with multiple partners). The donor's travel history is screened to minimize risk of transmitting infections, such as malaria. The donated blood is tested for signs of infectious diseases, such as HIV and hepatitis. The blood is then tested to be sure it is compatible with you in order to minimize the chance of a transfusion reaction. If you or a relative donates blood, this is often done in anticipation of surgery and is not appropriate for emergency situations. It takes many days to process the donated blood. RISKS AND COMPLICATIONS Although transfusion therapy is very safe and saves many lives, the main dangers of transfusion include:   Getting an infectious disease.  Developing a transfusion reaction. This is an allergic reaction to something in the blood you were given. Every precaution is taken to prevent this. The decision to have a blood transfusion has been considered carefully by your caregiver before blood is given. Blood is not given unless the benefits outweigh the risks. AFTER THE TRANSFUSION  Right after receiving a blood transfusion, you will usually feel much better and more energetic. This is especially true if your red blood cells have gotten low (anemic). The transfusion raises the level of the red blood cells which carry oxygen, and this usually causes an energy increase.  The  nurse administering the transfusion will monitor  you carefully for complications. HOME CARE INSTRUCTIONS  No special instructions are needed after a transfusion. You may find your energy is better. Speak with your caregiver about any limitations on activity for underlying diseases you may have. SEEK MEDICAL CARE IF:   Your condition is not improving after your transfusion.  You develop redness or irritation at the intravenous (IV) site. SEEK IMMEDIATE MEDICAL CARE IF:  Any of the following symptoms occur over the next 12 hours:  Shaking chills.  You have a temperature by mouth above 102 F (38.9 C), not controlled by medicine.  Chest, back, or muscle pain.  People around you feel you are not acting correctly or are confused.  Shortness of breath or difficulty breathing.  Dizziness and fainting.  You get a rash or develop hives.  You have a decrease in urine output.  Your urine turns a dark color or changes to pink, red, or brown. Any of the following symptoms occur over the next 10 days:  You have a temperature by mouth above 102 F (38.9 C), not controlled by medicine.  Shortness of breath.  Weakness after normal activity.  The white part of the eye turns yellow (jaundice).  You have a decrease in the amount of urine or are urinating less often.  Your urine turns a dark color or changes to pink, red, or brown. Document Released: 09/24/2000 Document Revised: 12/20/2011 Document Reviewed: 05/13/2008 [facility] Patient Information 2014 [place], [place].  _______________________________________________________________________

## 2018-06-28 ENCOUNTER — Encounter (HOSPITAL_COMMUNITY)
Admission: RE | Admit: 2018-06-28 | Discharge: 2018-06-28 | Disposition: A | Discharge status: Home / Self Care | Payer: Medicare Other | Source: Ambulatory Visit | Attending: Urology | Admitting: Urology

## 2018-06-28 ENCOUNTER — Encounter (HOSPITAL_COMMUNITY): Payer: Self-pay

## 2018-06-28 ENCOUNTER — Other Ambulatory Visit: Payer: Self-pay

## 2018-06-28 DIAGNOSIS — I440 Atrioventricular block, first degree: Secondary | ICD-10-CM | POA: Insufficient documentation

## 2018-06-28 DIAGNOSIS — N200 Calculus of kidney: Secondary | ICD-10-CM | POA: Diagnosis not present

## 2018-06-28 DIAGNOSIS — Z01818 Encounter for other preprocedural examination: Secondary | ICD-10-CM | POA: Diagnosis not present

## 2018-06-28 HISTORY — DX: Personal history of urinary calculi: Z87.442

## 2018-06-28 LAB — BASIC METABOLIC PANEL
ANION GAP: 8 (ref 5–15)
BUN: 16 mg/dL (ref 8–23)
CALCIUM: 8.9 mg/dL (ref 8.9–10.3)
CO2: 25 mmol/L (ref 22–32)
Chloride: 110 mmol/L (ref 98–111)
Creatinine, Ser: 1.07 mg/dL (ref 0.61–1.24)
Glucose, Bld: 90 mg/dL (ref 70–99)
Potassium: 4 mmol/L (ref 3.5–5.1)
SODIUM: 143 mmol/L (ref 135–145)

## 2018-06-28 LAB — CBC
HCT: 41.7 % (ref 39.0–52.0)
HEMOGLOBIN: 13.9 g/dL (ref 13.0–17.0)
MCH: 31.3 pg (ref 26.0–34.0)
MCHC: 33.3 g/dL (ref 30.0–36.0)
MCV: 93.9 fL (ref 78.0–100.0)
Platelets: 229 10*3/uL (ref 150–400)
RBC: 4.44 MIL/uL (ref 4.22–5.81)
RDW: 14.4 % (ref 11.5–15.5)
WBC: 4.4 10*3/uL (ref 4.0–10.5)

## 2018-06-28 NOTE — Progress Notes (Signed)
Chart left with Dolores Lory for f/u.

## 2018-06-30 ENCOUNTER — Other Ambulatory Visit: Payer: Self-pay | Admitting: Radiology

## 2018-07-02 NOTE — H&P (Signed)
Office Visit Report     06/20/2018   --------------------------------------------------------------------------------   Curtis Odom  MRN: [id number]  PRIMARY CARE:  Priscille Heidelberg. Little, MD  DOB: [birth year], 72 year old Male  REFERRING:  Lennette Bihari L. Little, MD  SSN: -[id number]  PROVIDER:  Raynelle Bring, M.D.    TREATING:  Curtis Odom, [place]    LOCATION:  [facility] [phone]   --------------------------------------------------------------------------------   CC/HPI: Pt presents today for pre-operative history and physical exam in anticipation of right percutaneous nephrolithotomy for a a 2.2 x 1.0cm calculus on 07/03/18 by Dr. Alinda Money. He is doing well and without complaint. Pt denies F/C, HA, CP, SOB, N/V, diarrhea/constipation, back pain, flank pain, hematuria, and dysuria.   HX:     CC: I have a history of kidney stones.  HPI: Curtis Odom is a 72 year-old male established patient who is here for F/U due to a history of renal calculi.  Complaining of intermittent radiating right lower back and flank pain since May but worsening in frequency over the past couple of weeks. Also associated with gross hematuria usually experience after an exertional activities such as mowing the lawn. He's had 3 kidney stone events in the past but hasn't passed any stone or stone material and 7 or 8 years. No prior history of kidney stone surgery. Denies an increase in frequency/urgency but has had some increased s/p discomfort when he feels the urge to void. He states this sensation does not occur often. There is no change in FOS. Denies fevers.   Prior to his prostatectomy in 2016, CT imaging did indicate a RLP non-obstructing calculus.   The patient's stone was on his right side. He did not pass a stone since the last office visit.   The patient has had flank pain since they were last seen. He has yes seen blood in his urine since the last visit. He is currently having flank pain.  He denies having back pain, groin pain, nausea, vomiting, fever, and chills. He has never had surgical treatment for calculi in the past.     ALLERGIES: No Known Drug Allergies    MEDICATIONS: Hydrocodone-Acetaminophen 5 mg-325 mg tablet 1-2 tablet PO Q 6 H PRN  Lopressor 50 mg tablet Oral  Sildenafil Citrate 20 MG Oral Tablet 0 Oral     GU PSH: Laparoscopy; Lymphadenectomy - 2016 Robotic Radical Prostatectomy - 2016      PSH Notes: Laparoscopy With Bilateral Total Pelvic Lymphadenectomy, Prostatect Retropubic Radical W/ Nerve Sparing Laparoscopic, Tonsillectomy   NON-GU PSH: Remove Tonsils - 2015    GU PMH: Gross hematuria - 05/08/2018 Renal calculus, Right - 05/08/2018 Microscopic hematuria - 01/13/2018 ED due to arterial insufficiency, Erectile dysfunction due to arterial insufficiency - 2017 Prostate Cancer, Prostate cancer - 2017 Bladder Diverticulum, Bladder diverticulum - 2016      PMH Notes:   1) Prostate cancer: He is s/p a BNS RAL radical prostatectomy on 05/22/15.   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 He underwent RAL bladder diverticulectomy concomitantly with his prostatectomy.       NON-GU PMH: Diverticulosis Hypertension    FAMILY HISTORY: Colon Cancer - Runs In Family Congestive Heart Failure - Runs In Family Deceased - Runs In Family Prostate Cancer - No Family  History   SOCIAL HISTORY: Marital Status: Married Preferred Language: English; Ethnicity: Not Hispanic Or Latino; Race: White Current Smoking Status: Patient has never smoked.  Does not use smokeless tobacco. Has never drank.  Does not use drugs. Drinks 1 caffeinated drink per day. Has not had a blood transfusion.     Notes: Activities of daily living  (ADL's), independent, Exercise habits, Married, Retired, No alcohol use, Never a smoker   REVIEW OF SYSTEMS:    GU Review Male:   Patient denies frequent urination, hard to postpone urination, burning/ pain with urination, get up at night to urinate, leakage of urine, stream starts and stops, trouble starting your stream, have to strain to urinate , erection problems, and penile pain.  Gastrointestinal (Upper):   Patient denies nausea, vomiting, and indigestion/ heartburn.  Gastrointestinal (Lower):   Patient denies diarrhea and constipation.  Constitutional:   Patient denies fever, night sweats, weight loss, and fatigue.  Skin:   Patient denies skin rash/ lesion and itching.  Eyes:   Patient denies blurred vision and double vision.  Ears/ Nose/ Throat:   Patient denies sore throat and sinus problems.  Hematologic/Lymphatic:   Patient denies swollen glands and easy bruising.  Cardiovascular:   Patient denies leg swelling and chest pains.  Respiratory:   Patient denies cough and shortness of breath.  Endocrine:   Patient denies excessive thirst.  Musculoskeletal:   Patient denies back pain and joint pain.  Neurological:   Patient denies headaches and dizziness.  Psychologic:   Patient denies depression and anxiety.   VITAL SIGNS:      06/20/2018 03:11 PM  Weight 185 lb / 83.91 kg  Height 71 in / 180.34 cm  BP 167/79 mmHg  Pulse 71 /min  Temperature 98.4 F / 36.8 C  BMI 25.8 kg/m   MULTI-SYSTEM PHYSICAL EXAMINATION:    Constitutional: Well-nourished. No physical deformities. Normally developed. Good grooming.  Neck: Neck symmetrical, not swollen. Normal tracheal position.  Respiratory: Normal breath sounds. No labored breathing, no use of accessory muscles.   Cardiovascular: Regular rate and rhythm. No murmur, no gallop. Normal temperature, normal extremity pulses.   Lymphatic: No enlargement of neck, axillae, groin.  Skin: No paleness, no jaundice, no cyanosis. No lesion, no ulcer,  no rash.  Neurologic / Psychiatric: Oriented to time, oriented to place, oriented to person. No depression, no anxiety, no agitation.  Gastrointestinal: No mass, no tenderness, no rigidity, non obese abdomen.  Eyes: Normal conjunctivae. Normal eyelids.  Ears, Nose, Mouth, and Throat: Left ear no scars, no lesions, no masses. Right ear no scars, no lesions, no masses. Nose no scars, no lesions, no masses. Normal hearing. Normal lips.  Musculoskeletal: Normal gait and station of head and neck.     PAST DATA REVIEWED:  Source Of History:  Patient  Records Review:   Previous Patient Records  Urine Test Review:   Urinalysis   06/20/18  Urinalysis  Urine Appearance Clear   Urine Color Yellow   Urine Glucose Neg mg/dL  Urine Bilirubin Neg mg/dL  Urine Ketones Neg mg/dL  Urine Specific Gravity 1.020   Urine Blood 3+ ery/uL  Urine pH <=5.0   Urine Protein 3+ mg/dL  Urine Urobilinogen 0.2 mg/dL  Urine Nitrites Neg   Urine Leukocyte Esterase Trace leu/uL  Urine WBC/hpf 0 - 5/hpf   Urine RBC/hpf >60/hpf   Urine Epithelial Cells NS (Not Seen)   Urine Bacteria Few (10-25/hpf)   Urine Mucous Present   Urine Yeast NS (Not  Seen)   Urine Trichomonas Not Present   Urine Cystals NS (Not Seen)   Urine Casts NS (Not Seen)   Urine Sperm Not Present    PROCEDURES:          Urinalysis w/Scope Dipstick Dipstick Cont'd Micro  Color: Yellow Bilirubin: Neg mg/dL WBC/hpf: 0 - 5/hpf  Appearance: Clear Ketones: Neg mg/dL RBC/hpf: >60/hpf  Specific Gravity: 1.020 Blood: 3+ ery/uL Bacteria: Few (10-25/hpf)  pH: <=5.0 Protein: 3+ mg/dL Cystals: NS (Not Seen)  Glucose: Neg mg/dL Urobilinogen: 0.2 mg/dL Casts: NS (Not Seen)    Nitrites: Neg Trichomonas: Not Present    Leukocyte Esterase: Trace leu/uL Mucous: Present      Epithelial Cells: NS (Not Seen)      Yeast: NS (Not Seen)      Sperm: Not Present    ASSESSMENT:      ICD-10 Details  1 GU:   Renal calculus - N20.0 Right   PLAN:            Orders Labs Urine Culture          Schedule Return Visit/Planned Activity: Keep Scheduled Appointment - Schedule Surgery          Document Letter(s):  Created for Patient: Clinical Summary         Notes:   There are no changes in the patients history or physical exam since last evaluation by Dr. Alinda Money. Pt is scheduled to undergo right perc nephrolithotomy on 07/03/18.   He has a few bacteria in his urine, therefore, will send a culture to ensure no infection. No ABx necessary at this time.   All pt's questions were answered to the best of my ability.          Next Appointment:      Next Appointment: 07/03/2018 12:15 PM    Appointment Type: Surgery     Location: [facility] [phone]    Provider: Raynelle Bring, M.D.    Reason for Visit: [facility]/EXT REC RT PCNL      * Signed by Mcarthur Rossetti, PA on 06/20/18 at 4:19 PM (EDT)*

## 2018-07-03 ENCOUNTER — Ambulatory Visit (HOSPITAL_COMMUNITY): Payer: Medicare Other

## 2018-07-03 ENCOUNTER — Encounter (HOSPITAL_COMMUNITY): Payer: Self-pay

## 2018-07-03 ENCOUNTER — Encounter (HOSPITAL_COMMUNITY)
Admission: RE | Disposition: A | Discharge status: Home / Self Care | Payer: Self-pay | Source: Ambulatory Visit | Attending: Urology

## 2018-07-03 ENCOUNTER — Ambulatory Visit (HOSPITAL_COMMUNITY)
Admission: RE | Admit: 2018-07-03 | Discharge: 2018-07-03 | Disposition: A | Discharge status: Home / Self Care | Payer: Medicare Other | Source: Ambulatory Visit | Attending: Family Medicine | Admitting: Family Medicine

## 2018-07-03 ENCOUNTER — Ambulatory Visit (HOSPITAL_COMMUNITY): Payer: Medicare Other | Admitting: Anesthesiology

## 2018-07-03 ENCOUNTER — Other Ambulatory Visit: Payer: Self-pay

## 2018-07-03 ENCOUNTER — Ambulatory Visit (HOSPITAL_COMMUNITY)
Admission: RE | Admit: 2018-07-03 | Discharge: 2018-07-03 | Disposition: A | Discharge status: Home / Self Care | Payer: Medicare Other | Source: Ambulatory Visit | Attending: Urology | Admitting: Urology

## 2018-07-03 ENCOUNTER — Observation Stay (HOSPITAL_COMMUNITY)
Admission: RE | Admit: 2018-07-03 | Discharge: 2018-07-04 | Disposition: A | Discharge status: Home / Self Care | Payer: Medicare Other | Source: Ambulatory Visit | Attending: Urology | Admitting: Urology

## 2018-07-03 DIAGNOSIS — N200 Calculus of kidney: Secondary | ICD-10-CM | POA: Diagnosis not present

## 2018-07-03 DIAGNOSIS — Z9889 Other specified postprocedural states: Secondary | ICD-10-CM | POA: Insufficient documentation

## 2018-07-03 DIAGNOSIS — Z8546 Personal history of malignant neoplasm of prostate: Secondary | ICD-10-CM | POA: Diagnosis not present

## 2018-07-03 DIAGNOSIS — C61 Malignant neoplasm of prostate: Secondary | ICD-10-CM | POA: Diagnosis not present

## 2018-07-03 DIAGNOSIS — Z800 Family history of malignant neoplasm of digestive organs: Secondary | ICD-10-CM | POA: Diagnosis not present

## 2018-07-03 DIAGNOSIS — I10 Essential (primary) hypertension: Secondary | ICD-10-CM | POA: Diagnosis not present

## 2018-07-03 DIAGNOSIS — N323 Diverticulum of bladder: Secondary | ICD-10-CM | POA: Diagnosis not present

## 2018-07-03 DIAGNOSIS — N529 Male erectile dysfunction, unspecified: Secondary | ICD-10-CM | POA: Diagnosis not present

## 2018-07-03 DIAGNOSIS — Z8249 Family history of ischemic heart disease and other diseases of the circulatory system: Secondary | ICD-10-CM | POA: Diagnosis not present

## 2018-07-03 DIAGNOSIS — Z9079 Acquired absence of other genital organ(s): Secondary | ICD-10-CM | POA: Diagnosis not present

## 2018-07-03 DIAGNOSIS — Z79899 Other long term (current) drug therapy: Secondary | ICD-10-CM | POA: Diagnosis not present

## 2018-07-03 HISTORY — PX: IR URETERAL STENT RIGHT NEW ACCESS W/O SEP NEPHROSTOMY CATH: IMG6076

## 2018-07-03 HISTORY — PX: NEPHROLITHOTOMY: SHX5134

## 2018-07-03 LAB — CBC WITH DIFFERENTIAL/PLATELET
Basophils Absolute: 0 10*3/uL (ref 0.0–0.1)
Basophils Relative: 1 %
Eosinophils Absolute: 0.1 10*3/uL (ref 0.0–0.7)
Eosinophils Relative: 2 %
HEMATOCRIT: 46.8 % (ref 39.0–52.0)
Hemoglobin: 16 g/dL (ref 13.0–17.0)
LYMPHS ABS: 1.6 10*3/uL (ref 0.7–4.0)
LYMPHS PCT: 35 %
MCH: 31.6 pg (ref 26.0–34.0)
MCHC: 34.2 g/dL (ref 30.0–36.0)
MCV: 92.5 fL (ref 78.0–100.0)
Monocytes Absolute: 0.5 10*3/uL (ref 0.1–1.0)
Monocytes Relative: 11 %
NEUTROS ABS: 2.3 10*3/uL (ref 1.7–7.7)
Neutrophils Relative %: 51 %
Platelets: 249 10*3/uL (ref 150–400)
RBC: 5.06 MIL/uL (ref 4.22–5.81)
RDW: 14.3 % (ref 11.5–15.5)
WBC: 4.6 10*3/uL (ref 4.0–10.5)

## 2018-07-03 LAB — TYPE AND SCREEN
ABO/RH(D): B POS
Antibody Screen: NEGATIVE

## 2018-07-03 LAB — BASIC METABOLIC PANEL
ANION GAP: 9 (ref 5–15)
Anion gap: 9 (ref 5–15)
BUN: 22 mg/dL (ref 8–23)
BUN: 22 mg/dL (ref 8–23)
CHLORIDE: 107 mmol/L (ref 98–111)
CHLORIDE: 108 mmol/L (ref 98–111)
CO2: 24 mmol/L (ref 22–32)
CO2: 23 mmol/L (ref 22–32)
Calcium: 9.4 mg/dL (ref 8.9–10.3)
Calcium: 8.8 mg/dL — ABNORMAL LOW (ref 8.9–10.3)
Creatinine, Ser: 1.21 mg/dL (ref 0.61–1.24)
Creatinine, Ser: 1.18 mg/dL (ref 0.61–1.24)
GFR calc Af Amer: >60 mL/min (ref >60)
GFR, EST NON AFRICAN AMERICAN: 58 mL/min — AB (ref >60)
GFR, EST NON AFRICAN AMERICAN: 59 mL/min — AB (ref >60)
GLUCOSE: 105 mg/dL — AB (ref 70–99)
Glucose, Bld: 135 mg/dL — ABNORMAL HIGH (ref 70–99)
POTASSIUM: 4.1 mmol/L (ref 3.5–5.1)
POTASSIUM: 4.6 mmol/L (ref 3.5–5.1)
SODIUM: 140 mmol/L (ref 135–145)
Sodium: 140 mmol/L (ref 135–145)

## 2018-07-03 LAB — HEMOGLOBIN AND HEMATOCRIT, BLOOD
HEMATOCRIT: 41.4 % (ref 39.0–52.0)
Hemoglobin: 14.2 g/dL (ref 13.0–17.0)

## 2018-07-03 LAB — PROTIME-INR
INR: 0.89
Prothrombin Time: 12 seconds (ref 11.4–15.2)

## 2018-07-03 SURGERY — NEPHROLITHOTOMY PERCUTANEOUS
Anesthesia: General | Laterality: Right

## 2018-07-03 MED ORDER — DOCUSATE SODIUM 100 MG PO CAPS: 100.0000 mg | ORAL_CAPSULE | Freq: Two times a day (BID) | ORAL | 0 refills | Status: AC

## 2018-07-03 MED ORDER — MIDAZOLAM HCL 2 MG/2ML IJ SOLN
INTRAMUSCULAR | Status: AC | PRN
  Administered 2018-07-03 (×2): 1 mg via INTRAVENOUS

## 2018-07-03 MED ORDER — IOPAMIDOL (ISOVUE-300) INJECTION 61%
INTRAVENOUS | Status: AC
  Administered 2018-07-03: 10 mL
  Filled 2018-07-03: qty 50

## 2018-07-03 MED ORDER — DEXAMETHASONE SODIUM PHOSPHATE 10 MG/ML IJ SOLN
INTRAMUSCULAR | Status: AC
  Filled 2018-07-03: qty 1

## 2018-07-03 MED ORDER — FENTANYL CITRATE (PF) 100 MCG/2ML IJ SOLN
INTRAMUSCULAR | Status: DC | PRN
  Administered 2018-07-03 (×2): 25 ug via INTRAVENOUS

## 2018-07-03 MED ORDER — TRAMADOL HCL 50 MG PO TABS: 50.0000 mg | ORAL_TABLET | Freq: Four times a day (QID) | ORAL | 0 refills | Status: DC | PRN

## 2018-07-03 MED ORDER — EPHEDRINE 5 MG/ML INJ
INTRAVENOUS | Status: AC
  Filled 2018-07-03: qty 10

## 2018-07-03 MED ORDER — ACETAMINOPHEN 10 MG/ML IV SOLN: 1000.0000 mg | Freq: Once | INTRAVENOUS | Status: DC | PRN

## 2018-07-03 MED ORDER — ACETAMINOPHEN 325 MG PO TABS: 650.0000 mg | ORAL_TABLET | ORAL | Status: DC | PRN

## 2018-07-03 MED ORDER — SUGAMMADEX SODIUM 200 MG/2ML IV SOLN
INTRAVENOUS | Status: DC | PRN
  Administered 2018-07-03: 200 mg via INTRAVENOUS

## 2018-07-03 MED ORDER — LIDOCAINE HCL 1 % IJ SOLN
INTRAMUSCULAR | Status: AC
  Filled 2018-07-03: qty 20

## 2018-07-03 MED ORDER — DOCUSATE SODIUM 100 MG PO CAPS
100.0000 mg | ORAL_CAPSULE | Freq: Two times a day (BID) | ORAL | Status: DC
  Administered 2018-07-03: 100 mg via ORAL
  Filled 2018-07-03: qty 1

## 2018-07-03 MED ORDER — PROPOFOL 10 MG/ML IV BOLUS
INTRAVENOUS | Status: AC
  Filled 2018-07-03: qty 20

## 2018-07-03 MED ORDER — LACTATED RINGERS IV SOLN
INTRAVENOUS | Status: DC
  Administered 2018-07-03 (×2): via INTRAVENOUS

## 2018-07-03 MED ORDER — ROCURONIUM BROMIDE 10 MG/ML (PF) SYRINGE
PREFILLED_SYRINGE | INTRAVENOUS | Status: AC
  Filled 2018-07-03: qty 10

## 2018-07-03 MED ORDER — ROCURONIUM BROMIDE 50 MG/5ML IV SOSY
PREFILLED_SYRINGE | INTRAVENOUS | Status: DC | PRN
  Administered 2018-07-03: 60 mg via INTRAVENOUS

## 2018-07-03 MED ORDER — DEXAMETHASONE SODIUM PHOSPHATE 10 MG/ML IJ SOLN
INTRAMUSCULAR | Status: DC | PRN
  Administered 2018-07-03: 10 mg via INTRAVENOUS

## 2018-07-03 MED ORDER — LIDOCAINE HCL (PF) 1 % IJ SOLN
INTRAMUSCULAR | Status: AC | PRN
  Administered 2018-07-03: 10 mL

## 2018-07-03 MED ORDER — PROMETHAZINE HCL 25 MG/ML IJ SOLN: 6.2500 mg | INTRAMUSCULAR | Status: DC | PRN

## 2018-07-03 MED ORDER — IOPAMIDOL (ISOVUE-300) INJECTION 61%
50.0000 mL | Freq: Once | INTRAVENOUS | Status: AC | PRN
  Administered 2018-07-03: 10 mL

## 2018-07-03 MED ORDER — SUGAMMADEX SODIUM 200 MG/2ML IV SOLN
INTRAVENOUS | Status: AC
  Filled 2018-07-03: qty 2

## 2018-07-03 MED ORDER — SODIUM CHLORIDE 0.9 % IV SOLN
INTRAVENOUS | Status: DC
  Administered 2018-07-03: 09:00:00 via INTRAVENOUS

## 2018-07-03 MED ORDER — PROPOFOL 10 MG/ML IV BOLUS
INTRAVENOUS | Status: DC | PRN
  Administered 2018-07-03: 150 mg via INTRAVENOUS

## 2018-07-03 MED ORDER — DIPHENHYDRAMINE HCL 50 MG/ML IJ SOLN: 12.5000 mg | Freq: Four times a day (QID) | INTRAMUSCULAR | Status: DC | PRN

## 2018-07-03 MED ORDER — ONDANSETRON HCL 4 MG/2ML IJ SOLN
INTRAMUSCULAR | Status: DC | PRN
  Administered 2018-07-03: 4 mg via INTRAVENOUS

## 2018-07-03 MED ORDER — LIDOCAINE 2% (20 MG/ML) 5 ML SYRINGE
INTRAMUSCULAR | Status: DC | PRN
  Administered 2018-07-03: 60 mg via INTRAVENOUS
  Administered 2018-07-03: 10 mg via INTRAVENOUS

## 2018-07-03 MED ORDER — MIDAZOLAM HCL 2 MG/2ML IJ SOLN
INTRAMUSCULAR | Status: AC
  Filled 2018-07-03: qty 4

## 2018-07-03 MED ORDER — KCL IN DEXTROSE-NACL 20-5-0.45 MEQ/L-%-% IV SOLN
INTRAVENOUS | Status: DC
  Administered 2018-07-03 – 2018-07-04 (×2): via INTRAVENOUS
  Filled 2018-07-03 (×3): qty 1000

## 2018-07-03 MED ORDER — HYDROMORPHONE HCL 1 MG/ML IJ SOLN: 0.2500 mg | INTRAMUSCULAR | Status: DC | PRN

## 2018-07-03 MED ORDER — FENTANYL CITRATE (PF) 100 MCG/2ML IJ SOLN
INTRAMUSCULAR | Status: AC
  Filled 2018-07-03: qty 2

## 2018-07-03 MED ORDER — SODIUM CHLORIDE 0.9 % IV SOLN
2.0000 g | Freq: Once | INTRAVENOUS | Status: AC
  Administered 2018-07-03: 2 g via INTRAVENOUS
  Filled 2018-07-03: qty 20

## 2018-07-03 MED ORDER — ONDANSETRON HCL 4 MG/2ML IJ SOLN: 4.0000 mg | INTRAMUSCULAR | Status: DC | PRN

## 2018-07-03 MED ORDER — DIPHENHYDRAMINE HCL 12.5 MG/5ML PO ELIX: 12.5000 mg | ORAL_SOLUTION | Freq: Four times a day (QID) | ORAL | Status: DC | PRN

## 2018-07-03 MED ORDER — ONDANSETRON HCL 4 MG/2ML IJ SOLN
INTRAMUSCULAR | Status: AC
  Filled 2018-07-03: qty 2

## 2018-07-03 MED ORDER — OXYCODONE HCL 5 MG/5ML PO SOLN
5.0000 mg | Freq: Once | ORAL | Status: DC | PRN
  Filled 2018-07-03: qty 5

## 2018-07-03 MED ORDER — METOPROLOL TARTRATE 25 MG PO TABS
25.0000 mg | ORAL_TABLET | Freq: Two times a day (BID) | ORAL | Status: DC
  Administered 2018-07-03: 25 mg via ORAL
  Filled 2018-07-03: qty 1

## 2018-07-03 MED ORDER — TRAMADOL HCL 50 MG PO TABS
50.0000 mg | ORAL_TABLET | Freq: Four times a day (QID) | ORAL | Status: DC | PRN
  Administered 2018-07-04 (×2): 50 mg via ORAL
  Filled 2018-07-03: qty 2

## 2018-07-03 MED ORDER — LIDOCAINE 2% (20 MG/ML) 5 ML SYRINGE
INTRAMUSCULAR | Status: AC
  Filled 2018-07-03: qty 5

## 2018-07-03 MED ORDER — EPHEDRINE SULFATE-NACL 50-0.9 MG/10ML-% IV SOSY
PREFILLED_SYRINGE | INTRAVENOUS | Status: DC | PRN
  Administered 2018-07-03 (×7): 10 mg via INTRAVENOUS

## 2018-07-03 MED ORDER — OXYCODONE HCL 5 MG PO TABS: 5.0000 mg | ORAL_TABLET | Freq: Once | ORAL | Status: DC | PRN

## 2018-07-03 MED ORDER — ZOLPIDEM TARTRATE 5 MG PO TABS: 5.0000 mg | ORAL_TABLET | Freq: Every evening | ORAL | Status: DC | PRN

## 2018-07-03 MED ORDER — IOHEXOL 300 MG/ML  SOLN
INTRAMUSCULAR | Status: DC | PRN
  Administered 2018-07-03: 10 mL

## 2018-07-03 MED ORDER — SODIUM CHLORIDE 0.9 % IV SOLN: 2.0000 g | Freq: Once | INTRAVENOUS | Status: DC

## 2018-07-03 MED ORDER — FENTANYL CITRATE (PF) 100 MCG/2ML IJ SOLN
INTRAMUSCULAR | Status: AC | PRN
  Administered 2018-07-03 (×2): 50 ug via INTRAVENOUS

## 2018-07-03 MED ORDER — SODIUM CHLORIDE 0.9 % IR SOLN
Status: DC | PRN
  Administered 2018-07-03: 9000 mL

## 2018-07-03 SURGICAL SUPPLY — 48 items
BAG URINE DRAINAGE (UROLOGICAL SUPPLIES) ×6 IMPLANT
BASKET STONE NITINOL 3FRX115MB (UROLOGICAL SUPPLIES) IMPLANT
BASKET ZERO TIP 1.9FR (BASKET) ×3 IMPLANT
BENZOIN TINCTURE PRP APPL 2/3 (GAUZE/BANDAGES/DRESSINGS) ×3 IMPLANT
BLADE SURG SZ10 CARB STEEL (BLADE) ×3 IMPLANT
CATH FOLEY 2W COUNCIL 20FR 5CC (CATHETERS) ×3 IMPLANT
CATH FOLEY 2WAY SLVR  5CC 14FR (CATHETERS) ×2
CATH FOLEY 2WAY SLVR 5CC 14FR (CATHETERS) ×1 IMPLANT
CATH IMAGER II 65CM (CATHETERS) IMPLANT
CATH ROBINSON RED A/P 20FR (CATHETERS) IMPLANT
CATH URET DUAL LUMEN 6-10FR 50 (CATHETERS) ×3 IMPLANT
CATH UROLOGY TORQUE 40 (MISCELLANEOUS) IMPLANT
CATH X-FORCE N30 NEPHROSTOMY (TUBING) ×3 IMPLANT
COVER SURGICAL LIGHT HANDLE (MISCELLANEOUS) ×3 IMPLANT
DRAPE C-ARM 42X120 X-RAY (DRAPES) ×3 IMPLANT
DRAPE LINGEMAN PERC (DRAPES) ×3 IMPLANT
DRAPE SURG IRRIG POUCH 19X23 (DRAPES) ×3 IMPLANT
DRAPE UTILITY XL STRL (DRAPES) IMPLANT
DRSG PAD ABDOMINAL 8X10 ST (GAUZE/BANDAGES/DRESSINGS) ×6 IMPLANT
DRSG TEGADERM 8X12 (GAUZE/BANDAGES/DRESSINGS) ×6 IMPLANT
FIBER LASER FLEXIVA 365 (UROLOGICAL SUPPLIES) IMPLANT
FIBER LASER TRAC TIP (UROLOGICAL SUPPLIES) IMPLANT
GAUZE SPONGE 4X4 12PLY STRL (GAUZE/BANDAGES/DRESSINGS) ×3 IMPLANT
GLOVE BIOGEL M STRL SZ7.5 (GLOVE) ×3 IMPLANT
GOWN STRL REUS W/TWL LRG LVL3 (GOWN DISPOSABLE) ×3 IMPLANT
GUIDEWIRE AMPLAZ .035X145 (WIRE) ×6 IMPLANT
GUIDEWIRE STR DUAL SENSOR (WIRE) IMPLANT
KIT BASIN OR (CUSTOM PROCEDURE TRAY) ×3 IMPLANT
KIT PROBE TRILOGY 3.9X350 (MISCELLANEOUS) ×3 IMPLANT
MANIFOLD NEPTUNE II (INSTRUMENTS) ×3 IMPLANT
NS IRRIG 1000ML POUR BTL (IV SOLUTION) ×3 IMPLANT
PACK CYSTO (CUSTOM PROCEDURE TRAY) ×3 IMPLANT
PAD ABD 8X10 STRL (GAUZE/BANDAGES/DRESSINGS) IMPLANT
SET IRRIG Y TYPE TUR BLADDER L (SET/KITS/TRAYS/PACK) IMPLANT
SHEATH PEELAWAY SET 9 (SHEATH) ×3 IMPLANT
SPONGE LAP 4X18 RFD (DISPOSABLE) ×3 IMPLANT
STENT ENDOURETEROTOMY 7-14 26C (STENTS) IMPLANT
STONE CATCHER W/TUBE ADAPTER (UROLOGICAL SUPPLIES) IMPLANT
SUT SILK 2 0 30  PSL (SUTURE) ×2
SUT SILK 2 0 30 PSL (SUTURE) ×1 IMPLANT
SYR 10ML LL (SYRINGE) IMPLANT
SYR 20CC LL (SYRINGE) ×3 IMPLANT
TOWEL OR NON WOVEN STRL DISP B (DISPOSABLE) ×3 IMPLANT
TRAY FOLEY MTR SLVR 16FR STAT (SET/KITS/TRAYS/PACK) ×3 IMPLANT
TUBING CONNECTING 10 (TUBING) ×4 IMPLANT
TUBING CONNECTING 10' (TUBING) ×2
TUBING UROLOGY SET (TUBING) ×3 IMPLANT
WATER STERILE IRR 1000ML POUR (IV SOLUTION) ×3 IMPLANT

## 2018-07-03 NOTE — H&P (Signed)
Chief Complaint: Patient was seen in consultation today for right renal calculi  Referring Physician(s): Borden,Lester  Supervising Physician: Sandi Mariscal  Patient Status: [name] - Out-pt  History of Present Illness: Curtis Odom is a 72 y.o. male with past medical history of prostate cancer, HTN, and kidney stone who presents to radiology department today for right percutaneous nephrostomy vs. Nephroureteral stent placement prior to nephrolithothomy in the OR with Dr. Alinda Money today.  He presents in his usual state of health.  Complains of occasional flank pain and hematuria, however none today.  He has been NPO.  He does not take blood thinners.   Past Medical History:  Diagnosis Date  . Cancer [facility])    prostate  . History of kidney stones   . Hypertension     Past Surgical History:  Procedure Laterality Date  . CYSTOSCOPY WITH STENT PLACEMENT Right 05/22/2015   Procedure: CYSTOSCOPY WITH STENT PLACEMENT;  Surgeon: Raynelle Bring, MD;  Location: [facility] ORS;  Service: Urology;  Laterality: Right;  . LYMPHADENECTOMY Bilateral 05/22/2015   Procedure: PELVIC LYMPHADENECTOMY;  Surgeon: Raynelle Bring, MD;  Location: [facility] ORS;  Service: Urology;  Laterality: Bilateral;  . PERCUTANEOUS NEPHROSTOLITHOTOMY     Dr. Alinda Money 07-03-18  . ROBOT ASSISTED LAPAROSCOPIC RADICAL PROSTATECTOMY N/A 05/22/2015   Procedure: ROBOTIC ASSISTED LAPAROSCOPIC RADICAL PROSTATECTOMY LEVEL 3;  Surgeon: Raynelle Bring, MD;  Location: [facility] ORS;  Service: Urology;  Laterality: N/A;  . ROBOTIC ASSISTED LAPAROSCOPIC BLADDER DIVERTICULECTOMY N/A 05/22/2015   Procedure: ROBOTIC ASSISTED LAPAROSCOPIC BLADDER DIVERTICULECTOMY;  Surgeon: Raynelle Bring, MD;  Location: [facility]S;  Service: Urology;  Laterality: N/A;  . TONSILLECTOMY     as child    Allergies: Patient has no known allergies.  Medications: Prior to Admission medications   Medication Sig Start Date End Date Taking? Authorizing Provider  acetaminophen (TYLENOL) 500  MG tablet Take 500-1,000 mg by mouth every 6 (six) hours as needed for mild pain.     [provider]  metoprolol (LOPRESSOR) 50 MG tablet Take 25 mg by mouth 2 (two) times daily.    [provider]     No family history on file.  Social History   Socioeconomic History  . Marital status: Married    Spouse name: Not on file  . Number of children: Not on file  . Years of education: Not on file  . Highest education level: Not on file  Occupational History  . Not on file  Social Needs  . Financial resource strain: Not on file  . Food insecurity:    Worry: Not on file    Inability: Not on file  . Transportation needs:    Medical: Not on file    Non-medical: Not on file  Tobacco Use  . Smoking status: Never Smoker  . Smokeless tobacco: Never Used  Substance and Sexual Activity  . Alcohol use: No  . Drug use: No  . Sexual activity: Yes  Lifestyle  . Physical activity:    Days per week: Not on file    Minutes per session: Not on file  . Stress: Not on file  Relationships  . Social connections:    Talks on phone: Not on file    Gets together: Not on file    Attends religious service: Not on file    Active member of club or organization: Not on file    Attends meetings of clubs or organizations: Not on file    Relationship status: Not on file  Other  Topics Concern  . Not on file  Social History Narrative  . Not on file   Review of Systems: A 12 point ROS discussed and pertinent positives are indicated in the HPI above.  All other systems are negative.  Review of Systems  Constitutional: Negative for fatigue and fever.  Respiratory: Negative for cough and shortness of breath.   Cardiovascular: Negative for chest pain.  Gastrointestinal: Negative for abdominal pain.  Genitourinary: Positive for flank pain (mild, occasional) and hematuria (occasional, with moderate intensity activity).  Musculoskeletal: Negative for back pain.    Vital Signs: BP (!)  172/91   Pulse (!) 58   Temp 98.2 F (36.8 C) (Oral)   Resp 16   SpO2 99%   Physical Exam  Constitutional: He is oriented to person, place, and time. He appears well-developed. No distress.  Neck: Normal range of motion. Neck supple.  Cardiovascular: Normal rate, regular rhythm and normal heart sounds. Exam reveals no gallop and no friction rub.  No murmur heard. Pulmonary/Chest: Effort normal and breath sounds normal. No respiratory distress.  Abdominal: Soft. Bowel sounds are normal. There is no tenderness.  Neurological: He is alert and oriented to person, place, and time.  Skin: Skin is warm and dry. He is not diaphoretic.  Psychiatric: He has a normal mood and affect. His behavior is normal. Judgment and thought content normal.  Nursing note and vitals reviewed.    MD Evaluation Airway: WNL Heart: WNL Abdomen: WNL Chest/ Lungs: WNL ASA  Classification: 3 Mallampati/Airway Score: One   Imaging: No results found.  Labs:  CBC: Recent Labs    06/28/18 1512 07/03/18 0841  WBC 4.4 4.6  HGB 13.9 16.0  HCT 41.7 46.8  PLT 229 249    COAGS: No results for input(s): INR, APTT in the last 8760 hours.  BMP: Recent Labs    06/28/18 1512  NA 143  K 4.0  CL 110  CO2 25  GLUCOSE 90  BUN 16  CALCIUM 8.9  CREATININE 1.07  GFRNONAA >60  GFRAA >60    LIVER FUNCTION TESTS: No results for input(s): BILITOT, AST, ALT, ALKPHOS, PROT, ALBUMIN in the last 8760 hours.  TUMOR MARKERS: No results for input(s): AFPTM, CEA, CA199, CHROMGRNA in the last 8760 hours.  Assessment and Plan: Patient with past medical history of right renal calculi presents with complaint of occasional hematuria and flank pain.  IR consulted for right percutaneous nephrostomy vs. Nephroureteral stent placement at the request of Dr. Alinda Money for stone retrieval planned in the OR today.  Patient presents today in their usual state of health.  He has been NPO and is not currently on blood  thinners.   Risks and benefits were discussed with the patient including, but not limited to, infection, bleeding, significant bleeding causing loss or decrease in renal function or damage to adjacent structures.   All of the patient's questions were answered, patient is agreeable to proceed.  Consent signed and in chart.  Thank you for this interesting consult.  I greatly enjoyed meeting MARVEN VELEY and look forward to participating in their care.  A copy of this report was sent to the requesting provider on this date.  Electronically Signed: Docia Barrier, PA 07/03/2018, 9:01 AM   I spent a total of  30 Minutes   in face to face in clinical consultation, greater than 50% of which was counseling/coordinating care for right renal calculi.

## 2018-07-03 NOTE — Op Note (Signed)
Preoperative diagnosis: Right renal calculus (2.2 cm)  Postoperative diagnosis: Right renal calculus (2.2 cm)  Procedure: Right percutaneous nephrostolithotomy  Surgeon: Adriana Simas  Anesthesia: General  Complications: None  EBL: Less than 100 cc  Specimen: Right renal calculus  Disposition of specimen: [facility]  Indication: Curtis Odom is a 72 year old gentleman with a large right renal calculus.  After reviewing options for management/treatment, he elected to proceed with the above procedure.  The potential risks, complications, and the expected recovery process was discussed in detail.  Informed consent was obtained.  Description of procedure: After right percutaneous renal access was obtained in interventional radiology, the patient was transferred to the preoperative holding area.  He was then taken back to the operating room and general anesthesia was induced.  He was placed in the prone position, prepped and draped in usual sterile fashion, and preoperative antibiotics were administered.  A preoperative timeout was performed.  Under fluoroscopic guidance, a stiff Amplatz wire was advanced down the Kumpe catheter into the bladder.  The Kumpe catheter was then removed.  A dual-lumen catheter was then inserted over this wire and a second stiff Amplatz wire was placed through the dual-lumen catheter into the bladder.  The dual-lumen catheter was then removed.  A #10 blade was used to incise the skin and fascia next to the access point.  Over the working wire, a 30 French dilating balloon was inserted and positioned just at the edge of the lower pole renal calyx.  The balloon was then inflated to 16 mmHg.  All waste was seen to have disappeared from the balloon under fluoroscopic imaging.  The nephrostomy sheath was then advanced over the balloon with its distal edge left at the edge of the renal calyx.  The balloon was then deflated and was withdrawn.  Using  the rigid nephroscope, the renal pelvis was examined.  Clot was removed with the rigid grasper.  The large renal pelvic stone was immediately identified.  Using ultrasonic lithotripsy, the stone was fragmented and removed with suction.  Once all obvious stone fragments were removed, I replaced the rigid nephroscope with the flexible nephroscope.  Further evaluation of the entire renal collecting system was performed.  No upper pole or interpolar stones were identified.  All stones had been removed from the lower pole in the renal pelvis.  At the UPJ, a small stone fragment was identified and was removed with a 0 tip nitinol basket.  Again, reinspection of the entire renal collecting system was performed and the patient appeared to be stone free.  At this point, a 27 [place] council tip catheter was advanced over the working wire into the renal pelvis.  It was secured to the skin with 2-0 silk suture.  Over the safety wire, the Kumpe catheter was replaced down to the bladder and secured to the skin with a 2-0 silk suture.  The patient appeared to tolerate the procedure well and without complications.  A sterile dressing was applied.  He was able to be extubated and transferred to the recovery unit in satisfactory condition.

## 2018-07-03 NOTE — Progress Notes (Signed)
Post-op note  Subjective: The patient is doing well.  No complaints. Pain controlled.  Objective: Vital signs in last 24 hours: Temp:  [97.4 F (36.3 C)-98.2 F (36.8 C)] 97.4 F (36.3 C) (09/23 1430) Pulse Rate:  [51-73] 65 (09/23 1430) Resp:  [11-21] 20 (09/23 1430) BP: (107-172)/(65-91) 145/75 (09/23 1430) SpO2:  [96 %-100 %] 100 % (09/23 1430) Weight:  [85.3 kg] 85.3 kg (09/23 0848)  Intake/Output from previous day: No intake/output data recorded. Intake/Output this shift: Total I/O In: 1003.6 [I.V.:1003.6] Out: 160 [Urine:60; Blood:100]  Physical Exam:  General: Alert and oriented. Abdomen: Soft, Nondistended. Incisions: Clean and dry.  Lab Results: Recent Labs    07/03/18 0841 07/03/18 1348  HGB 16.0 14.2  HCT 46.8 41.4    Assessment/Plan: POD#0   1) Continue to monitor   Pryor Curia. MD   LOS: 0 days   Zimir Kittleson,LES 07/03/2018, 4:02 PM

## 2018-07-03 NOTE — Interval H&P Note (Signed)
History and Physical Interval Note:  07/03/2018 9:51 AM  Curtis Odom  has presented today for surgery, with the diagnosis of RIGHT RENAL CALCULUS  The various methods of treatment have been discussed with the patient and family. After consideration of risks, benefits and other options for treatment, the patient has consented to  Procedure(s): NEPHROLITHOTOMY PERCUTANEOUS (Right) as a surgical intervention .  The patient's history has been reviewed, patient examined, no change in status, stable for surgery.  I have reviewed the patient's chart and labs.  Questions were answered to the patient's satisfaction.     Curtis Odom,LES

## 2018-07-03 NOTE — Anesthesia Procedure Notes (Signed)
Procedure Name: Intubation Date/Time: 07/03/2018 11:35 AM Performed by: Maxwell Caul, CRNA Pre-anesthesia Checklist: Patient identified, Emergency Drugs available, Suction available and Patient being monitored Patient Re-evaluated:Patient Re-evaluated prior to induction Oxygen Delivery Method: Circle system utilized Preoxygenation: Pre-oxygenation with 100% oxygen Induction Type: IV induction Ventilation: Mask ventilation without difficulty Laryngoscope Size: Mac and 4 Grade View: Grade I Tube type: Oral Tube size: 7.5 mm Number of attempts: 1 Airway Equipment and Method: Stylet Placement Confirmation: ETT inserted through vocal cords under direct vision,  positive ETCO2 and breath sounds checked- equal and bilateral Secured at: 22 cm Tube secured with: Tape Dental Injury: Teeth and Oropharynx as per pre-operative assessment

## 2018-07-03 NOTE — Transfer of Care (Signed)
Immediate Anesthesia Transfer of Care Note  Patient: Curtis Odom  Procedure(s) Performed: NEPHROLITHOTOMY PERCUTANEOUS (Right )  Patient Location: PACU  Anesthesia Type:General  Level of Consciousness: awake, alert  and oriented  Airway & Oxygen Therapy: Patient Spontanous Breathing and Patient connected to face mask oxygen  Post-op Assessment: Report given to RN and Post -op Vital signs reviewed and stable  Post vital signs: Reviewed and stable  Last Vitals:  Vitals Value Taken Time  BP 147/75 07/03/2018  1:27 PM  Temp    Pulse 70 07/03/2018  1:28 PM  Resp 20 07/03/2018  1:28 PM  SpO2 100 % 07/03/2018  1:28 PM  Vitals shown include unvalidated device data.  Last Pain:  Vitals:   07/03/18 1010  TempSrc: Oral  PainSc: 0-No pain         Complications: No apparent anesthesia complications

## 2018-07-03 NOTE — Procedures (Signed)
Pre Procedural Dx: Right sided Nephrolithiasis. Post Procedural Dx: Same  Successful placement of right sided 5 Fr catheter for PNL access.  Tip of catheter is within the urinary bladder.  EBL: None No immediate complications.  Ronny Bacon, MD Pager #: [phone]

## 2018-07-03 NOTE — Discharge Instructions (Signed)
1.  Activity:  You are encouraged to ambulate frequently (about every hour during waking hours) to help prevent blood clots from forming in your legs or lungs.  However, you should not engage in any heavy lifting (> 10-15 lbs), strenuous activity, or straining. 2. Diet: You should advance your diet as instructed by your physician.  It will be normal to have some bloating, nausea, and abdominal discomfort intermittently. 3. Prescriptions:  You will be provided a prescription for pain medication to take as needed.  If your pain is not severe enough to require the prescription pain medication, you may take extra strength Tylenol instead which will have less side effects.  You should also take a prescribed stool softener to avoid straining with bowel movements as the prescription pain medication may constipate you. 4. Incisions: You will have a bandage and can remove this once there is no further drainage from the site.  Typically, this will occur in 48-72 hrs.  No additional care is needed. 5. What to call us about: You should call the office [phone]) if you develop fever > 101 or develop persistent vomiting.

## 2018-07-03 NOTE — Anesthesia Preprocedure Evaluation (Signed)
Anesthesia Evaluation  Patient identified by MRN, date of birth, ID band Patient awake    Reviewed: Allergy & Precautions, NPO status , Patient's Chart, lab work & pertinent test results  Airway Mallampati: II  TM Distance: >3 FB Neck ROM: Full    Dental no notable dental hx.    Pulmonary neg pulmonary ROS,    Pulmonary exam normal breath sounds clear to auscultation       Cardiovascular hypertension, Normal cardiovascular exam Rhythm:Regular Rate:Normal     Neuro/Psych negative neurological ROS  negative psych ROS   GI/Hepatic negative GI ROS, Neg liver ROS,   Endo/Other  negative endocrine ROS  Renal/GU negative Renal ROS  negative genitourinary   Musculoskeletal negative musculoskeletal ROS (+)   Abdominal   Peds negative pediatric ROS (+)  Hematology negative hematology ROS (+)   Anesthesia Other Findings   Reproductive/Obstetrics negative OB ROS                             Anesthesia Physical Anesthesia Plan  ASA: II  Anesthesia Plan: General   Post-op Pain Management:    Induction: Intravenous  PONV Risk Score and Plan: 2 and Ondansetron, Dexamethasone and Treatment may vary due to age or medical condition  Airway Management Planned: Oral ETT  Additional Equipment:   Intra-op Plan:   Post-operative Plan: Extubation in OR  Informed Consent: I have reviewed the patients History and Physical, chart, labs and discussed the procedure including the risks, benefits and alternatives for the proposed anesthesia with the patient or authorized representative who has indicated his/her understanding and acceptance.     Dental advisory given  Plan Discussed with: CRNA and Surgeon  Anesthesia Plan Comments:         Anesthesia Quick Evaluation  

## 2018-07-04 ENCOUNTER — Encounter (HOSPITAL_COMMUNITY): Payer: Self-pay | Admitting: Urology

## 2018-07-04 DIAGNOSIS — N200 Calculus of kidney: Secondary | ICD-10-CM | POA: Diagnosis not present

## 2018-07-04 DIAGNOSIS — N323 Diverticulum of bladder: Secondary | ICD-10-CM | POA: Diagnosis not present

## 2018-07-04 DIAGNOSIS — N529 Male erectile dysfunction, unspecified: Secondary | ICD-10-CM | POA: Diagnosis not present

## 2018-07-04 DIAGNOSIS — Z9079 Acquired absence of other genital organ(s): Secondary | ICD-10-CM | POA: Diagnosis not present

## 2018-07-04 DIAGNOSIS — Z79899 Other long term (current) drug therapy: Secondary | ICD-10-CM | POA: Diagnosis not present

## 2018-07-04 DIAGNOSIS — I10 Essential (primary) hypertension: Secondary | ICD-10-CM | POA: Diagnosis not present

## 2018-07-04 LAB — BASIC METABOLIC PANEL
Anion gap: 9 (ref 5–15)
BUN: 23 mg/dL (ref 8–23)
CALCIUM: 9.3 mg/dL (ref 8.9–10.3)
CO2: 22 mmol/L (ref 22–32)
CREATININE: 1.4 mg/dL — AB (ref 0.61–1.24)
Chloride: 108 mmol/L (ref 98–111)
GFR calc Af Amer: 56 mL/min — ABNORMAL LOW (ref >60)
GFR, EST NON AFRICAN AMERICAN: 48 mL/min — AB (ref >60)
Glucose, Bld: 193 mg/dL — ABNORMAL HIGH (ref 70–99)
POTASSIUM: 5 mmol/L (ref 3.5–5.1)
SODIUM: 139 mmol/L (ref 135–145)

## 2018-07-04 LAB — HEMOGLOBIN AND HEMATOCRIT, BLOOD
HCT: 40.8 % (ref 39.0–52.0)
Hemoglobin: 13.7 g/dL (ref 13.0–17.0)

## 2018-07-04 NOTE — Progress Notes (Signed)
Patient ID: Curtis Odom, male   DOB: [birth year], 72 y.o.   MRN: [id number]  1 Day Post-Op Subjective: Pt doing well.  Minimal pain overnight.  Objective: Vital signs in last 24 hours: Temp:  [97.4 F (36.3 C)-98.2 F (36.8 C)] 97.8 F (36.6 C) (09/24 0616) Pulse Rate:  [51-100] 89 (09/24 0616) Resp:  [11-21] 16 (09/24 0616) BP: (107-172)/(65-91) 138/68 (09/24 0616) SpO2:  [95 %-100 %] 96 % (09/24 0616) Weight:  [85.3 kg] 85.3 kg (09/23 0848)  Intake/Output from previous day: 09/23 0701 - 09/24 0700 In: 2503.2 [I.V.:2503.2] Out: 2310 [Urine:2210; Blood:100] Intake/Output this shift: No intake/output data recorded.  Physical Exam:  General: Alert and oriented CV: RRR Lungs: Clear R PCN draining well GU: Foley draining well  Lab Results: Recent Labs    07/03/18 0841 07/03/18 1348 07/04/18 0525  HGB 16.0 14.2 13.7  HCT 46.8 41.4 40.8   BMET Recent Labs    07/03/18 1348 07/04/18 0525  NA 140 139  K 4.6 5.0  CL 108 108  CO2 23 22  GLUCOSE 135* 193*  BUN 22 23  CREATININE 1.18 1.40*  CALCIUM 8.8* 9.3     Procedures:  I removed urethral catheter and PCN.  After monitoring him for about 15 minutes, I removed his safety catheter after ensuring no bleeding from nephrostomy site.   Assessment/Plan: POD # 1 s/p R PCNL - Patient rendered stone free and nephrostomy removed today.  Discharge home.   LOS: 0 days   Rakin Lemelle,LES 07/04/2018, 7:27 AM

## 2018-07-04 NOTE — Anesthesia Postprocedure Evaluation (Signed)
Anesthesia Post Note  Patient: Curtis Odom  Procedure(s) Performed: NEPHROLITHOTOMY PERCUTANEOUS (Right )     Patient location during evaluation: PACU Anesthesia Type: General Level of consciousness: awake and alert Pain management: pain level controlled Vital Signs Assessment: post-procedure vital signs reviewed and stable Respiratory status: spontaneous breathing, nonlabored ventilation, respiratory function stable and patient connected to nasal cannula oxygen Cardiovascular status: blood pressure returned to baseline and stable Postop Assessment: no apparent nausea or vomiting Anesthetic complications: no    Last Vitals:  Vitals:   07/04/18 0202 07/04/18 0616  BP: 132/72 138/68  Pulse: 84 89  Resp: 20 16  Temp: (!) 36.4 C 36.6 C  SpO2: 95% 96%    Last Pain:  Vitals:   07/04/18 0616  TempSrc: Oral  PainSc:                  Delshawn Stech S

## 2018-07-04 NOTE — Discharge Summary (Signed)
Date of admission: 07/03/2018  Date of discharge: 07/04/2018  Admission diagnosis: Right renal calculus  Discharge diagnosis: Right renal calculus  Secondary diagnoses: Hypertension  History and Physical: For full details, please see admission history and physical. Briefly, Curtis Odom is a 72 y.o. year old patient with a large 2.2 cm right renal calculus.   Hospital Course: He was taken to the OR and underwent R PCNL on 07/03/18.  He remained hemodynamically stable and his nephrostomy tube was removed on POD # 1.  He was discharged home in good condition.  Laboratory values:  Recent Labs    07/03/18 0841 07/03/18 1348 07/04/18 0525  HGB 16.0 14.2 13.7  HCT 46.8 41.4 40.8   Recent Labs    07/03/18 1348 07/04/18 0525  CREATININE 1.18 1.40*    Disposition: Home  Discharge instruction: The patient was instructed to be ambulatory but told to refrain from heavy lifting, strenuous activity, or driving for 1 week.  Discharge medications:  Allergies as of 07/04/2018   No Known Allergies     Medication List    TAKE these medications   acetaminophen 500 MG tablet Commonly known as:  TYLENOL Take 500-1,000 mg by mouth every 6 (six) hours as needed for mild pain.   docusate sodium 100 MG capsule Commonly known as:  COLACE Take 1 capsule (100 mg total) by mouth 2 (two) times daily.   metoprolol tartrate 50 MG tablet Commonly known as:  LOPRESSOR Take 25 mg by mouth 2 (two) times daily.   traMADol 50 MG tablet Commonly known as:  ULTRAM Take 1-2 tablets (50-100 mg total) by mouth every 6 (six) hours as needed (pain).       Followup:  Follow-up Information    Curtis Bring, MD.   Specialty:  Urology Why:  07/19/18 at 12:30 PM Contact information: [place] [address] [phone]

## 2018-07-04 NOTE — Addendum Note (Signed)
Addendum  created 07/04/18 0758 by Lollie Sails, CRNA   Charge Capture section accepted

## 2018-07-04 NOTE — Progress Notes (Signed)
Resumed care of pt. Agree with previous RN's assessment. Will continue with plan of care.  

## 2018-07-19 DIAGNOSIS — N200 Calculus of kidney: Secondary | ICD-10-CM | POA: Diagnosis not present

## 2018-08-15 DIAGNOSIS — C61 Malignant neoplasm of prostate: Secondary | ICD-10-CM | POA: Diagnosis not present

## 2018-08-21 DIAGNOSIS — H2513 Age-related nuclear cataract, bilateral: Secondary | ICD-10-CM | POA: Diagnosis not present

## 2018-08-21 DIAGNOSIS — H5203 Hypermetropia, bilateral: Secondary | ICD-10-CM | POA: Diagnosis not present

## 2018-08-21 DIAGNOSIS — Z135 Encounter for screening for eye and ear disorders: Secondary | ICD-10-CM | POA: Diagnosis not present

## 2018-10-31 DIAGNOSIS — D225 Melanocytic nevi of trunk: Secondary | ICD-10-CM | POA: Diagnosis not present

## 2018-10-31 DIAGNOSIS — D1801 Hemangioma of skin and subcutaneous tissue: Secondary | ICD-10-CM | POA: Diagnosis not present

## 2018-10-31 DIAGNOSIS — L570 Actinic keratosis: Secondary | ICD-10-CM | POA: Diagnosis not present

## 2018-10-31 DIAGNOSIS — L821 Other seborrheic keratosis: Secondary | ICD-10-CM | POA: Diagnosis not present

## 2018-10-31 DIAGNOSIS — Z85828 Personal history of other malignant neoplasm of skin: Secondary | ICD-10-CM | POA: Diagnosis not present

## 2018-12-13 DIAGNOSIS — I10 Essential (primary) hypertension: Secondary | ICD-10-CM | POA: Diagnosis not present

## 2019-02-08 DIAGNOSIS — C61 Malignant neoplasm of prostate: Secondary | ICD-10-CM | POA: Diagnosis not present

## 2019-02-08 DIAGNOSIS — N200 Calculus of kidney: Secondary | ICD-10-CM | POA: Diagnosis not present

## 2019-02-21 DIAGNOSIS — C61 Malignant neoplasm of prostate: Secondary | ICD-10-CM | POA: Diagnosis not present

## 2019-02-21 DIAGNOSIS — N200 Calculus of kidney: Secondary | ICD-10-CM | POA: Diagnosis not present

## 2019-06-13 DIAGNOSIS — Z0000 Encounter for general adult medical examination without abnormal findings: Secondary | ICD-10-CM | POA: Diagnosis not present

## 2019-06-13 DIAGNOSIS — R899 Unspecified abnormal finding in specimens from other organs, systems and tissues: Secondary | ICD-10-CM | POA: Diagnosis not present

## 2019-06-13 DIAGNOSIS — Z1159 Encounter for screening for other viral diseases: Secondary | ICD-10-CM | POA: Diagnosis not present

## 2019-06-13 DIAGNOSIS — I10 Essential (primary) hypertension: Secondary | ICD-10-CM | POA: Diagnosis not present

## 2019-06-13 DIAGNOSIS — Z86010 Personal history of colonic polyps: Secondary | ICD-10-CM | POA: Diagnosis not present

## 2019-06-13 DIAGNOSIS — E782 Mixed hyperlipidemia: Secondary | ICD-10-CM | POA: Diagnosis not present

## 2019-06-13 DIAGNOSIS — Z87442 Personal history of urinary calculi: Secondary | ICD-10-CM | POA: Diagnosis not present

## 2019-06-13 DIAGNOSIS — Z8546 Personal history of malignant neoplasm of prostate: Secondary | ICD-10-CM | POA: Diagnosis not present

## 2019-08-13 DIAGNOSIS — R0981 Nasal congestion: Secondary | ICD-10-CM | POA: Diagnosis not present

## 2019-08-13 DIAGNOSIS — R509 Fever, unspecified: Secondary | ICD-10-CM | POA: Diagnosis not present

## 2019-08-13 DIAGNOSIS — T148XXA Other injury of unspecified body region, initial encounter: Secondary | ICD-10-CM | POA: Diagnosis not present

## 2019-08-13 DIAGNOSIS — U071 COVID-19: Secondary | ICD-10-CM | POA: Diagnosis not present

## 2019-09-21 DIAGNOSIS — C61 Malignant neoplasm of prostate: Secondary | ICD-10-CM | POA: Diagnosis not present

## 2019-12-17 DIAGNOSIS — L821 Other seborrheic keratosis: Secondary | ICD-10-CM | POA: Diagnosis not present

## 2019-12-17 DIAGNOSIS — D0462 Carcinoma in situ of skin of left upper limb, including shoulder: Secondary | ICD-10-CM | POA: Diagnosis not present

## 2019-12-17 DIAGNOSIS — L570 Actinic keratosis: Secondary | ICD-10-CM | POA: Diagnosis not present

## 2019-12-17 DIAGNOSIS — D1801 Hemangioma of skin and subcutaneous tissue: Secondary | ICD-10-CM | POA: Diagnosis not present

## 2019-12-17 DIAGNOSIS — D2272 Melanocytic nevi of left lower limb, including hip: Secondary | ICD-10-CM | POA: Diagnosis not present

## 2019-12-17 DIAGNOSIS — Z85828 Personal history of other malignant neoplasm of skin: Secondary | ICD-10-CM | POA: Diagnosis not present

## 2020-03-31 DIAGNOSIS — C61 Malignant neoplasm of prostate: Secondary | ICD-10-CM | POA: Diagnosis not present

## 2020-03-31 DIAGNOSIS — Z87442 Personal history of urinary calculi: Secondary | ICD-10-CM | POA: Diagnosis not present

## 2020-04-04 DIAGNOSIS — C61 Malignant neoplasm of prostate: Secondary | ICD-10-CM | POA: Diagnosis not present

## 2020-04-04 DIAGNOSIS — Z87442 Personal history of urinary calculi: Secondary | ICD-10-CM | POA: Diagnosis not present

## 2020-06-03 DIAGNOSIS — Z23 Encounter for immunization: Secondary | ICD-10-CM | POA: Diagnosis not present

## 2020-06-26 DIAGNOSIS — I10 Essential (primary) hypertension: Secondary | ICD-10-CM | POA: Diagnosis not present

## 2020-06-26 DIAGNOSIS — R7301 Impaired fasting glucose: Secondary | ICD-10-CM | POA: Diagnosis not present

## 2020-06-26 DIAGNOSIS — Z86010 Personal history of colonic polyps: Secondary | ICD-10-CM | POA: Diagnosis not present

## 2020-06-26 DIAGNOSIS — Z87442 Personal history of urinary calculi: Secondary | ICD-10-CM | POA: Diagnosis not present

## 2020-06-26 DIAGNOSIS — E782 Mixed hyperlipidemia: Secondary | ICD-10-CM | POA: Diagnosis not present

## 2020-06-26 DIAGNOSIS — Z0000 Encounter for general adult medical examination without abnormal findings: Secondary | ICD-10-CM | POA: Diagnosis not present

## 2020-06-26 DIAGNOSIS — L570 Actinic keratosis: Secondary | ICD-10-CM | POA: Diagnosis not present

## 2020-06-26 DIAGNOSIS — N289 Disorder of kidney and ureter, unspecified: Secondary | ICD-10-CM | POA: Diagnosis not present

## 2020-06-26 DIAGNOSIS — Z8546 Personal history of malignant neoplasm of prostate: Secondary | ICD-10-CM | POA: Diagnosis not present

## 2020-06-26 DIAGNOSIS — R7309 Other abnormal glucose: Secondary | ICD-10-CM | POA: Diagnosis not present

## 2020-07-01 DIAGNOSIS — Z23 Encounter for immunization: Secondary | ICD-10-CM | POA: Diagnosis not present

## 2020-12-22 DIAGNOSIS — D692 Other nonthrombocytopenic purpura: Secondary | ICD-10-CM | POA: Diagnosis not present

## 2020-12-22 DIAGNOSIS — L821 Other seborrheic keratosis: Secondary | ICD-10-CM | POA: Diagnosis not present

## 2020-12-22 DIAGNOSIS — Z85828 Personal history of other malignant neoplasm of skin: Secondary | ICD-10-CM | POA: Diagnosis not present

## 2020-12-22 DIAGNOSIS — L570 Actinic keratosis: Secondary | ICD-10-CM | POA: Diagnosis not present

## 2020-12-22 DIAGNOSIS — L820 Inflamed seborrheic keratosis: Secondary | ICD-10-CM | POA: Diagnosis not present

## 2020-12-22 DIAGNOSIS — C44519 Basal cell carcinoma of skin of other part of trunk: Secondary | ICD-10-CM | POA: Diagnosis not present

## 2020-12-22 DIAGNOSIS — D1801 Hemangioma of skin and subcutaneous tissue: Secondary | ICD-10-CM | POA: Diagnosis not present

## 2021-02-03 DIAGNOSIS — Q141 Congenital malformation of retina: Secondary | ICD-10-CM | POA: Diagnosis not present

## 2021-02-03 DIAGNOSIS — H2513 Age-related nuclear cataract, bilateral: Secondary | ICD-10-CM | POA: Diagnosis not present

## 2021-02-03 DIAGNOSIS — H5203 Hypermetropia, bilateral: Secondary | ICD-10-CM | POA: Diagnosis not present

## 2021-03-30 DIAGNOSIS — C61 Malignant neoplasm of prostate: Secondary | ICD-10-CM | POA: Diagnosis not present

## 2021-04-03 DIAGNOSIS — Z87442 Personal history of urinary calculi: Secondary | ICD-10-CM | POA: Diagnosis not present

## 2021-04-03 DIAGNOSIS — C61 Malignant neoplasm of prostate: Secondary | ICD-10-CM | POA: Diagnosis not present

## 2021-07-22 DIAGNOSIS — Z0000 Encounter for general adult medical examination without abnormal findings: Secondary | ICD-10-CM | POA: Diagnosis not present

## 2021-07-22 DIAGNOSIS — I10 Essential (primary) hypertension: Secondary | ICD-10-CM | POA: Diagnosis not present

## 2021-07-22 DIAGNOSIS — Z8546 Personal history of malignant neoplasm of prostate: Secondary | ICD-10-CM | POA: Diagnosis not present

## 2021-07-22 DIAGNOSIS — R7301 Impaired fasting glucose: Secondary | ICD-10-CM | POA: Diagnosis not present

## 2021-07-22 DIAGNOSIS — E782 Mixed hyperlipidemia: Secondary | ICD-10-CM | POA: Diagnosis not present

## 2021-07-22 DIAGNOSIS — Z86010 Personal history of colonic polyps: Secondary | ICD-10-CM | POA: Diagnosis not present

## 2021-07-22 DIAGNOSIS — Z87442 Personal history of urinary calculi: Secondary | ICD-10-CM | POA: Diagnosis not present

## 2021-09-25 DIAGNOSIS — I10 Essential (primary) hypertension: Secondary | ICD-10-CM | POA: Diagnosis not present

## 2022-02-02 DIAGNOSIS — L814 Other melanin hyperpigmentation: Secondary | ICD-10-CM | POA: Diagnosis not present

## 2022-02-02 DIAGNOSIS — D3612 Benign neoplasm of peripheral nerves and autonomic nervous system, upper limb, including shoulder: Secondary | ICD-10-CM | POA: Diagnosis not present

## 2022-02-02 DIAGNOSIS — D1801 Hemangioma of skin and subcutaneous tissue: Secondary | ICD-10-CM | POA: Diagnosis not present

## 2022-02-02 DIAGNOSIS — Z85828 Personal history of other malignant neoplasm of skin: Secondary | ICD-10-CM | POA: Diagnosis not present

## 2022-02-02 DIAGNOSIS — L570 Actinic keratosis: Secondary | ICD-10-CM | POA: Diagnosis not present

## 2022-02-02 DIAGNOSIS — D225 Melanocytic nevi of trunk: Secondary | ICD-10-CM | POA: Diagnosis not present

## 2022-02-02 DIAGNOSIS — L821 Other seborrheic keratosis: Secondary | ICD-10-CM | POA: Diagnosis not present

## 2022-02-02 DIAGNOSIS — D2272 Melanocytic nevi of left lower limb, including hip: Secondary | ICD-10-CM | POA: Diagnosis not present

## 2022-03-17 DIAGNOSIS — Q141 Congenital malformation of retina: Secondary | ICD-10-CM | POA: Diagnosis not present

## 2022-03-17 DIAGNOSIS — H2513 Age-related nuclear cataract, bilateral: Secondary | ICD-10-CM | POA: Diagnosis not present

## 2022-03-17 DIAGNOSIS — H5203 Hypermetropia, bilateral: Secondary | ICD-10-CM | POA: Diagnosis not present

## 2022-03-29 DIAGNOSIS — C61 Malignant neoplasm of prostate: Secondary | ICD-10-CM | POA: Diagnosis not present

## 2022-04-02 DIAGNOSIS — C61 Malignant neoplasm of prostate: Secondary | ICD-10-CM | POA: Diagnosis not present

## 2022-04-02 DIAGNOSIS — Z87442 Personal history of urinary calculi: Secondary | ICD-10-CM | POA: Diagnosis not present

## 2022-08-06 DIAGNOSIS — R7301 Impaired fasting glucose: Secondary | ICD-10-CM | POA: Diagnosis not present

## 2022-08-06 DIAGNOSIS — Z0000 Encounter for general adult medical examination without abnormal findings: Secondary | ICD-10-CM | POA: Diagnosis not present

## 2022-08-06 DIAGNOSIS — Z86010 Personal history of colonic polyps: Secondary | ICD-10-CM | POA: Diagnosis not present

## 2022-08-06 DIAGNOSIS — E782 Mixed hyperlipidemia: Secondary | ICD-10-CM | POA: Diagnosis not present

## 2022-08-06 DIAGNOSIS — I10 Essential (primary) hypertension: Secondary | ICD-10-CM | POA: Diagnosis not present

## 2022-08-06 DIAGNOSIS — Z8546 Personal history of malignant neoplasm of prostate: Secondary | ICD-10-CM | POA: Diagnosis not present

## 2022-10-28 DIAGNOSIS — E782 Mixed hyperlipidemia: Secondary | ICD-10-CM | POA: Diagnosis not present

## 2022-11-22 ENCOUNTER — Ambulatory Visit
Admission: EM | Admit: 2022-11-22 | Discharge: 2022-11-22 | Disposition: A | Discharge status: Home / Self Care | Payer: Medicare Other | Attending: Family Medicine | Admitting: Family Medicine

## 2022-11-22 DIAGNOSIS — J209 Acute bronchitis, unspecified: Secondary | ICD-10-CM | POA: Diagnosis not present

## 2022-11-22 DIAGNOSIS — I10 Essential (primary) hypertension: Secondary | ICD-10-CM | POA: Insufficient documentation

## 2022-11-22 DIAGNOSIS — H6122 Impacted cerumen, left ear: Secondary | ICD-10-CM

## 2022-11-22 MED ORDER — AZITHROMYCIN 250 MG PO TABS: ORAL_TABLET | ORAL | 0 refills | Status: DC

## 2022-11-22 NOTE — ED Provider Notes (Signed)
[facility]    CSN: [id number] Arrival date & time: 11/22/22  1555      History   Chief Complaint Chief Complaint  Patient presents with   Cough    X 2 weeks    HPI Curtis Odom is a 77 y.o. male.   HPI  He has been sick for 2 weeks.  Started off with a cold.  Had runny nose sore throat sinus congestion.  He states now he is having more coughing.  Is coughing up yellow mucus.  He is having bad coughing spells that are hard to control.  States that his chest hurts with coughing.  Wife was just treated for bronchitis and required antibiotics.  He is using Alka-Seltzer plus, Flonase as needed.  No cigarette smoking.  No underlying lung disease such as asthma or COPD  Past Medical History:  Diagnosis Date   Cancer Correct Care Of )    prostate   History of kidney stones    Hypertension     Patient Active Problem List   Diagnosis Date Noted   Essential hypertension 11/22/2022   Renal calculus 07/03/2018   Prostate cancer ([place]) 05/22/2015    Past Surgical History:  Procedure Laterality Date   CYSTOSCOPY WITH STENT PLACEMENT Right 05/22/2015   Procedure: CYSTOSCOPY WITH STENT PLACEMENT;  Surgeon: Raynelle Bring, MD;  Location: [facility];  Service: Urology;  Laterality: Right;   IR URETERAL STENT RIGHT NEW ACCESS W/O SEP NEPHROSTOMY CATH  07/03/2018   LYMPHADENECTOMY Bilateral 05/22/2015   Procedure: PELVIC LYMPHADENECTOMY;  Surgeon: Raynelle Bring, MD;  Location: [facility];  Service: Urology;  Laterality: Bilateral;   NEPHROLITHOTOMY Right 07/03/2018   Procedure: NEPHROLITHOTOMY PERCUTANEOUS;  Surgeon: Raynelle Bring, MD;  Location: [facility];  Service: Urology;  Laterality: Right;   PERCUTANEOUS NEPHROSTOLITHOTOMY     Dr. Alinda Money 07-03-18   ROBOT ASSISTED LAPAROSCOPIC RADICAL PROSTATECTOMY N/A 05/22/2015   Procedure: ROBOTIC ASSISTED LAPAROSCOPIC RADICAL PROSTATECTOMY LEVEL 3;  Surgeon: Raynelle Bring, MD;  Location: [facility];  Service: Urology;  Laterality: N/A;   ROBOTIC ASSISTED  LAPAROSCOPIC BLADDER DIVERTICULECTOMY N/A 05/22/2015   Procedure: ROBOTIC ASSISTED LAPAROSCOPIC BLADDER DIVERTICULECTOMY;  Surgeon: Raynelle Bring, MD;  Location: [facility];  Service: Urology;  Laterality: N/A;   TONSILLECTOMY     as child       Home Medications    Prior to Admission medications   Medication Sig Start Date End Date Taking? Authorizing Provider  azithromycin (ZITHROMAX Z-PAK) 250 MG tablet Take two pills today followed by one a day until gone 11/22/22  Yes Raylene Everts, MD  losartan (COZAAR) 50 MG tablet Take 50 mg by mouth daily. 09/01/22  Yes [provider]  potassium citrate (UROCIT-K) 10 MEQ (1080 MG) SR tablet Take 2 tablets by mouth 2 (two) times daily. 11/16/22  Yes [provider]  acetaminophen (TYLENOL) 500 MG tablet Take 500-1,000 mg by mouth every 6 (six) hours as needed for mild pain.     [provider]  docusate sodium (COLACE) 100 MG capsule Take 1 capsule (100 mg total) by mouth 2 (two) times daily. 07/03/18   Raynelle Bring, MD  metoprolol (LOPRESSOR) 50 MG tablet Take 25 mg by mouth 2 (two) times daily.    [provider]    Family History History reviewed. No pertinent family history.  Social History Social History   Tobacco Use   Smoking status: Never   Smokeless tobacco: Never  Vaping Use   Vaping Use: Never used  Substance Use Topics  Alcohol use: No   Drug use: No     Allergies   Ciprofloxacin   Review of Systems Review of Systems  See HPI Physical Exam Triage Vital Signs ED Triage Vitals  Enc Vitals Group     BP 11/22/22 1639 (!) 187/82     Pulse Rate 11/22/22 1639 83     Resp 11/22/22 1639 17     Temp 11/22/22 1639 98.2 F (36.8 C)     Temp Source 11/22/22 1639 Oral     SpO2 11/22/22 1639 94 %     Weight --      Height --      Head Circumference --      Peak Flow --      Pain Score 11/22/22 1640 0     Pain Loc --      Pain Edu? --      Excl. in [place]? --    No data  found.  Updated Vital Signs BP (!) 187/82 (BP Location: Right Arm)   Pulse 83   Temp 98.2 F (36.8 C) (Oral)   Resp 17   SpO2 94%      Physical Exam Constitutional:      General: He is not in acute distress.    Appearance: He is well-developed. He is ill-appearing.  HENT:     Head: Normocephalic and atraumatic.     Right Ear: Tympanic membrane and ear canal normal.     Left Ear: Tympanic membrane and ear canal normal.     Ears:     Comments: Patient hard of hearing.  Wears hearing aids.  On examination the left TM is occluded by cerumen.  This is lavaged and curetted clear.  Patient voices improvement    Nose: Nose normal. No rhinorrhea.     Mouth/Throat:     Pharynx: No posterior oropharyngeal erythema.  Eyes:     Conjunctiva/sclera: Conjunctivae normal.     Pupils: Pupils are equal, round, and reactive to light.  Cardiovascular:     Rate and Rhythm: Normal rate and regular rhythm.     Heart sounds: Normal heart sounds.  Pulmonary:     Effort: Pulmonary effort is normal. No respiratory distress.     Breath sounds: Rhonchi present.  Abdominal:     General: There is no distension.     Palpations: Abdomen is soft.  Musculoskeletal:        General: Normal range of motion.     Cervical back: Normal range of motion.  Skin:    General: Skin is warm and dry.  Neurological:     Mental Status: He is alert.      UC Treatments / Results  Labs (all labs ordered are listed, but only abnormal results are displayed) Labs Reviewed - No data to display  EKG   Radiology No results found.  Procedures Procedures (including critical care time)  Medications Ordered in UC Medications - No data to display  Initial Impression / Assessment and Plan / UC Course  I have reviewed the triage vital signs and the nursing notes.  Pertinent labs & imaging results that were available during my care of the patient were reviewed by me and considered in my medical decision making (see  chart for details).    Because of the change in symptoms, length of symptoms I am going to add an antibiotic.  Explained that this likely started as a virus, however, he should be improving Final Clinical Impressions(s) / UC Diagnoses  Final diagnoses:  Acute bronchitis, unspecified organism  Hearing loss due to cerumen impaction, left     Discharge Instructions      Take Mucinex DM 2 times a day.  This will help loosen the mucus in your chest and control cough Take the Z-Pak as prescribed.  2 pills today then 1 a day until gone Make sure you are drinking plenty of water.  Try adding a little bit more, your mouth looks dry Call Dr. Sheryn Bison if not improved by next week   ED Prescriptions     Medication Sig Dispense Auth. Provider   azithromycin (ZITHROMAX Z-PAK) 250 MG tablet Take two pills today followed by one a day until gone 6 tablet Meda Coffee Jennette Banker, MD      PDMP not reviewed this encounter.   Raylene Everts, MD 11/22/22 [phone]

## 2022-11-22 NOTE — Discharge Instructions (Addendum)
Take Mucinex DM 2 times a day.  This will help loosen the mucus in your chest and control cough Take the Z-Pak as prescribed.  2 pills today then 1 a day until gone Make sure you are drinking plenty of water.  Try adding a little bit more, your mouth looks dry Call Dr. Sheryn Bison if not improved by next week

## 2022-11-22 NOTE — ED Triage Notes (Signed)
Pt here today with wife who says he's been dealing with cough x 2 weeks. Aklazeltzer plus and Flonase prn.

## 2023-02-15 DIAGNOSIS — L814 Other melanin hyperpigmentation: Secondary | ICD-10-CM | POA: Diagnosis not present

## 2023-02-15 DIAGNOSIS — D0462 Carcinoma in situ of skin of left upper limb, including shoulder: Secondary | ICD-10-CM | POA: Diagnosis not present

## 2023-02-15 DIAGNOSIS — L570 Actinic keratosis: Secondary | ICD-10-CM | POA: Diagnosis not present

## 2023-02-15 DIAGNOSIS — D485 Neoplasm of uncertain behavior of skin: Secondary | ICD-10-CM | POA: Diagnosis not present

## 2023-02-15 DIAGNOSIS — Z85828 Personal history of other malignant neoplasm of skin: Secondary | ICD-10-CM | POA: Diagnosis not present

## 2023-02-15 DIAGNOSIS — L821 Other seborrheic keratosis: Secondary | ICD-10-CM | POA: Diagnosis not present

## 2023-03-23 ENCOUNTER — Ambulatory Visit
Admission: EM | Admit: 2023-03-23 | Discharge: 2023-03-23 | Disposition: A | Discharge status: Home / Self Care | Payer: Medicare Other | Attending: Family Medicine | Admitting: Family Medicine

## 2023-03-23 ENCOUNTER — Encounter: Payer: Self-pay | Admitting: Emergency Medicine

## 2023-03-23 DIAGNOSIS — H6691 Otitis media, unspecified, right ear: Secondary | ICD-10-CM

## 2023-03-23 DIAGNOSIS — H6123 Impacted cerumen, bilateral: Secondary | ICD-10-CM | POA: Diagnosis not present

## 2023-03-23 MED ORDER — AMOXICILLIN-POT CLAVULANATE 875-125 MG PO TABS: 1.0000 | ORAL_TABLET | Freq: Two times a day (BID) | ORAL | 0 refills | Status: AC

## 2023-03-23 NOTE — Discharge Instructions (Addendum)
Instructed patient to take medication as directed with food to completion.  Encouraged increase daily water intake to 64 ounces per day while taking this medication.  Advised patient not to submerge head underwater for the next 10 days.  Advised if symptoms worsen and/or unresolved please follow-up with PCP or here for further evaluation.

## 2023-03-23 NOTE — ED Provider Notes (Signed)
[facility]    CSN: [id number] Arrival date & time: 03/23/23  1551      History   Chief Complaint Chief Complaint  Patient presents with   Hearing Problem    HPI Curtis [facility] is a 77 y.o. male.   HPI 77 year old male presents with worsening/decreased hearing over the last week bilaterally.  Patient reports history of cerumen impaction.  Patient is accompanied by his wife this afternoon.  PMH significant for prostate cancer, HTN, and history of nephrolithiasis.  Past Medical History:  Diagnosis Date   Cancer [facility])    prostate   History of kidney stones    Hypertension     Patient Active Problem List   Diagnosis Date Noted   Essential hypertension 11/22/2022   Renal calculus 07/03/2018   Prostate cancer (HCC) 05/22/2015    Past Surgical History:  Procedure Laterality Date   CYSTOSCOPY WITH STENT PLACEMENT Right 05/22/2015   Procedure: CYSTOSCOPY WITH STENT PLACEMENT;  Surgeon: Heloise Purpura, MD;  Location: [facility];  Service: Urology;  Laterality: Right;   IR URETERAL STENT RIGHT NEW ACCESS W/O SEP NEPHROSTOMY CATH  07/03/2018   LYMPHADENECTOMY Bilateral 05/22/2015   Procedure: PELVIC LYMPHADENECTOMY;  Surgeon: Heloise Purpura, MD;  Location: [facility];  Service: Urology;  Laterality: Bilateral;   NEPHROLITHOTOMY Right 07/03/2018   Procedure: NEPHROLITHOTOMY PERCUTANEOUS;  Surgeon: Heloise Purpura, MD;  Location: [facility];  Service: Urology;  Laterality: Right;   PERCUTANEOUS NEPHROSTOLITHOTOMY     Dr. Laverle Patter 07-03-18   ROBOT ASSISTED LAPAROSCOPIC RADICAL PROSTATECTOMY N/A 05/22/2015   Procedure: ROBOTIC ASSISTED LAPAROSCOPIC RADICAL PROSTATECTOMY LEVEL 3;  Surgeon: Heloise Purpura, MD;  Location: [facility];  Service: Urology;  Laterality: N/A;   ROBOTIC ASSISTED LAPAROSCOPIC BLADDER DIVERTICULECTOMY N/A 05/22/2015   Procedure: ROBOTIC ASSISTED LAPAROSCOPIC BLADDER DIVERTICULECTOMY;  Surgeon: Heloise Purpura, MD;  Location: [facility];  Service: Urology;  Laterality: N/A;    TONSILLECTOMY     as child       Home Medications    Prior to Admission medications   Medication Sig Start Date End Date Taking? Authorizing Provider  amoxicillin-clavulanate (AUGMENTIN) 875-125 MG tablet Take 1 tablet by mouth 2 (two) times daily for 10 days. 03/23/23 04/02/23 Yes Trevor Iha, FNP  acetaminophen (TYLENOL) 500 MG tablet Take 500-1,000 mg by mouth every 6 (six) hours as needed for mild pain.     [provider]  docusate sodium (COLACE) 100 MG capsule Take 1 capsule (100 mg total) by mouth 2 (two) times daily. 07/03/18   Heloise Purpura, MD  losartan (COZAAR) 50 MG tablet Take 50 mg by mouth daily. 09/01/22   [provider]  metoprolol (LOPRESSOR) 50 MG tablet Take 25 mg by mouth 2 (two) times daily.    [provider]  potassium citrate (UROCIT-K) 10 MEQ (1080 MG) SR tablet Take 2 tablets by mouth 2 (two) times daily. 11/16/22   [provider]    Family History History reviewed. No pertinent family history.  Social History Social History   Tobacco Use   Smoking status: Never   Smokeless tobacco: Never  Vaping Use   Vaping Use: Never used  Substance Use Topics   Alcohol use: No   Drug use: No     Allergies   Ciprofloxacin   Review of Systems Review of Systems   Physical Exam Triage Vital Signs ED Triage Vitals  Enc Vitals Group     BP 03/23/23 1618 (!) 166/91     Pulse Rate 03/23/23 1618 (!)  105     Resp 03/23/23 1618 16     Temp 03/23/23 1618 98 F (36.7 C)     Temp Source 03/23/23 1618 Oral     SpO2 03/23/23 1618 96 %     Weight --      Height --      Head Circumference --      Peak Flow --      Pain Score 03/23/23 1619 0     Pain Loc --      Pain Edu? --      Excl. in GC? --    No data found.  Updated Vital Signs BP (!) 166/91 (BP Location: Left Arm)   Pulse (!) 105   Temp 98 F (36.7 C) (Oral)   Resp 16   SpO2 96%   Physical Exam Vitals and nursing note reviewed.  Constitutional:       Appearance: Normal appearance. He is normal weight.  HENT:     Head: Normocephalic and atraumatic.     Right Ear: External ear normal.     Left Ear: External ear normal.     Ears:     Comments: EACs occluded with excessive cerumen unable to visualize either TM; post bilateral ear lavage: left EAC-clear erythematous secondary to removing excessive cerumen, left TM-cloudy, retracted; right EAC-erythematous, with mild bleeding secondary to removing excessive cerumen, right TM-red rimmed, erythematous with older perforation noted at 6 o'clock position    Mouth/Throat:     Mouth: Mucous membranes are moist.     Pharynx: Oropharynx is clear.  Eyes:     Extraocular Movements: Extraocular movements intact.     Conjunctiva/sclera: Conjunctivae normal.     Pupils: Pupils are equal, round, and reactive to light.  Cardiovascular:     Rate and Rhythm: Normal rate and regular rhythm.     Pulses: Normal pulses.     Heart sounds: Normal heart sounds.  Pulmonary:     Effort: Pulmonary effort is normal.     Breath sounds: Normal breath sounds. No wheezing, rhonchi or rales.  Musculoskeletal:        General: Normal range of motion.     Cervical back: Normal range of motion and neck supple.  Skin:    General: Skin is warm and dry.  Neurological:     General: No focal deficit present.     Mental Status: He is alert and oriented to person, place, and time. Mental status is at baseline.      UC Treatments / Results  Labs (all labs ordered are listed, but only abnormal results are displayed) Labs Reviewed - No data to display  EKG   Radiology No results found.  Procedures Procedures (including critical care time)  Medications Ordered in UC Medications - No data to display  Initial Impression / Assessment and Plan / UC Course  I have reviewed the triage vital signs and the nursing notes.  Pertinent labs & imaging results that were available during my care of the patient were reviewed by  me and considered in my medical decision making (see chart for details).     MDM: 1.  Bilateral impacted cerumen-resolved with bilateral ear lavage; 2.  Acute right otitis media-Rx'd Augmentin 875/125 mg tablet twice daily x 10 days. Instructed patient to take medication as directed with food to completion.  Encouraged increase daily water intake to 64 ounces per day while taking this medication.  Advised patient not to submerge head underwater for the next 10  days.  Advised if symptoms worsen and/or unresolved please follow-up with PCP or here for further evaluation.  Patient discharged home, hemodynamically stable.   Final Clinical Impressions(s) / UC Diagnoses   Final diagnoses:  Bilateral impacted cerumen  Acute right otitis media     Discharge Instructions      Instructed patient to take medication as directed with food to completion.  Encouraged increase daily water intake to 64 ounces per day while taking this medication.  Advised patient not to submerge head underwater for the next 10 days.  Advised if symptoms worsen and/or unresolved please follow-up with PCP or here for further evaluation.     ED Prescriptions     Medication Sig Dispense Auth. Provider   amoxicillin-clavulanate (AUGMENTIN) 875-125 MG tablet Take 1 tablet by mouth 2 (two) times daily for 10 days. 20 tablet Trevor Iha, FNP      PDMP not reviewed this encounter.   Trevor Iha, FNP 03/23/23 1713

## 2023-03-23 NOTE — ED Triage Notes (Signed)
Noticed worsening/decreased hearing over the last week bilaterally. Bilateral cerumen impactions present in triage. Denies ear pain, fever. Does use hearing aids daily. Has been using "sweet oil" in ears to try to help symptoms with no improvement.

## 2023-03-30 DIAGNOSIS — C61 Malignant neoplasm of prostate: Secondary | ICD-10-CM | POA: Diagnosis not present

## 2023-04-06 DIAGNOSIS — C61 Malignant neoplasm of prostate: Secondary | ICD-10-CM | POA: Diagnosis not present

## 2023-08-09 DIAGNOSIS — Z7185 Encounter for immunization safety counseling: Secondary | ICD-10-CM | POA: Diagnosis not present

## 2023-08-09 DIAGNOSIS — Z87442 Personal history of urinary calculi: Secondary | ICD-10-CM | POA: Diagnosis not present

## 2023-08-09 DIAGNOSIS — E782 Mixed hyperlipidemia: Secondary | ICD-10-CM | POA: Diagnosis not present

## 2023-08-09 DIAGNOSIS — H6122 Impacted cerumen, left ear: Secondary | ICD-10-CM | POA: Diagnosis not present

## 2023-08-09 DIAGNOSIS — I10 Essential (primary) hypertension: Secondary | ICD-10-CM | POA: Diagnosis not present

## 2023-08-09 DIAGNOSIS — Z860100 Personal history of colon polyps, unspecified: Secondary | ICD-10-CM | POA: Diagnosis not present

## 2023-08-09 DIAGNOSIS — R7301 Impaired fasting glucose: Secondary | ICD-10-CM | POA: Diagnosis not present

## 2023-08-09 DIAGNOSIS — Z0000 Encounter for general adult medical examination without abnormal findings: Secondary | ICD-10-CM | POA: Diagnosis not present

## 2023-09-14 DIAGNOSIS — D72819 Decreased white blood cell count, unspecified: Secondary | ICD-10-CM | POA: Diagnosis not present

## 2023-09-27 DIAGNOSIS — H5203 Hypermetropia, bilateral: Secondary | ICD-10-CM | POA: Diagnosis not present

## 2023-09-27 DIAGNOSIS — H2513 Age-related nuclear cataract, bilateral: Secondary | ICD-10-CM | POA: Diagnosis not present

## 2023-09-27 DIAGNOSIS — Q141 Congenital malformation of retina: Secondary | ICD-10-CM | POA: Diagnosis not present

## 2024-02-20 DIAGNOSIS — L814 Other melanin hyperpigmentation: Secondary | ICD-10-CM | POA: Diagnosis not present

## 2024-02-20 DIAGNOSIS — D692 Other nonthrombocytopenic purpura: Secondary | ICD-10-CM | POA: Diagnosis not present

## 2024-02-20 DIAGNOSIS — L821 Other seborrheic keratosis: Secondary | ICD-10-CM | POA: Diagnosis not present

## 2024-02-20 DIAGNOSIS — D1801 Hemangioma of skin and subcutaneous tissue: Secondary | ICD-10-CM | POA: Diagnosis not present

## 2024-02-20 DIAGNOSIS — Z85828 Personal history of other malignant neoplasm of skin: Secondary | ICD-10-CM | POA: Diagnosis not present

## 2024-02-20 DIAGNOSIS — L570 Actinic keratosis: Secondary | ICD-10-CM | POA: Diagnosis not present

## 2024-02-20 DIAGNOSIS — D225 Melanocytic nevi of trunk: Secondary | ICD-10-CM | POA: Diagnosis not present

## 2024-02-20 DIAGNOSIS — D2272 Melanocytic nevi of left lower limb, including hip: Secondary | ICD-10-CM | POA: Diagnosis not present

## 2024-04-19 DIAGNOSIS — C61 Malignant neoplasm of prostate: Secondary | ICD-10-CM | POA: Diagnosis not present

## 2024-04-24 DIAGNOSIS — C61 Malignant neoplasm of prostate: Secondary | ICD-10-CM | POA: Diagnosis not present

## 2024-04-24 DIAGNOSIS — Z87442 Personal history of urinary calculi: Secondary | ICD-10-CM | POA: Diagnosis not present

## 2024-08-10 DIAGNOSIS — I10 Essential (primary) hypertension: Secondary | ICD-10-CM | POA: Diagnosis not present

## 2024-08-10 DIAGNOSIS — R7301 Impaired fasting glucose: Secondary | ICD-10-CM | POA: Diagnosis not present

## 2024-08-10 DIAGNOSIS — D72819 Decreased white blood cell count, unspecified: Secondary | ICD-10-CM | POA: Diagnosis not present

## 2024-08-10 DIAGNOSIS — E7439 Other disorders of intestinal carbohydrate absorption: Secondary | ICD-10-CM | POA: Diagnosis not present

## 2024-08-10 DIAGNOSIS — E782 Mixed hyperlipidemia: Secondary | ICD-10-CM | POA: Diagnosis not present

## 2024-08-14 DIAGNOSIS — K635 Polyp of colon: Secondary | ICD-10-CM | POA: Diagnosis not present

## 2024-08-14 DIAGNOSIS — E782 Mixed hyperlipidemia: Secondary | ICD-10-CM | POA: Diagnosis not present

## 2024-08-14 DIAGNOSIS — H6122 Impacted cerumen, left ear: Secondary | ICD-10-CM | POA: Diagnosis not present

## 2024-08-14 DIAGNOSIS — R899 Unspecified abnormal finding in specimens from other organs, systems and tissues: Secondary | ICD-10-CM | POA: Diagnosis not present

## 2024-08-14 DIAGNOSIS — Z0000 Encounter for general adult medical examination without abnormal findings: Secondary | ICD-10-CM | POA: Diagnosis not present

## 2024-08-14 DIAGNOSIS — E7439 Other disorders of intestinal carbohydrate absorption: Secondary | ICD-10-CM | POA: Diagnosis not present

## 2024-08-14 DIAGNOSIS — Z1331 Encounter for screening for depression: Secondary | ICD-10-CM | POA: Diagnosis not present

## 2024-08-14 DIAGNOSIS — N289 Disorder of kidney and ureter, unspecified: Secondary | ICD-10-CM | POA: Diagnosis not present

## 2024-08-14 DIAGNOSIS — I10 Essential (primary) hypertension: Secondary | ICD-10-CM | POA: Diagnosis not present

## 2024-08-14 DIAGNOSIS — Z7189 Other specified counseling: Secondary | ICD-10-CM | POA: Diagnosis not present

## 2024-09-20 DIAGNOSIS — N289 Disorder of kidney and ureter, unspecified: Secondary | ICD-10-CM | POA: Diagnosis not present
